# Patient Record
Sex: Male | Born: 1985
Health system: Southern US, Community
[De-identification: ages and names within clinical notes are randomized; demographics above are authoritative.]

## PROBLEM LIST (undated history)

## (undated) DIAGNOSIS — J42 Unspecified chronic bronchitis: Secondary | ICD-10-CM

## (undated) DIAGNOSIS — Z8619 Personal history of other infectious and parasitic diseases: Secondary | ICD-10-CM

## (undated) DIAGNOSIS — K219 Gastro-esophageal reflux disease without esophagitis: Secondary | ICD-10-CM

## (undated) DIAGNOSIS — Z114 Encounter for screening for human immunodeficiency virus [HIV]: Secondary | ICD-10-CM

## (undated) HISTORY — DX: Gastro-esophageal reflux disease without esophagitis: K21.9

## (undated) HISTORY — DX: Unspecified chronic bronchitis: J42

## (undated) HISTORY — DX: Encounter for screening for human immunodeficiency virus (HIV): Z11.4

## (undated) HISTORY — DX: Personal history of other infectious and parasitic diseases: Z86.19

---

## 2015-01-03 ENCOUNTER — Encounter (HOSPITAL_COMMUNITY): Payer: Self-pay | Admitting: Emergency Medicine

## 2015-01-03 ENCOUNTER — Emergency Department (HOSPITAL_COMMUNITY)
Admission: EM | Admit: 2015-01-03 | Discharge: 2015-01-03 | Disposition: A | Discharge status: Home / Self Care | Payer: Self-pay | Attending: Emergency Medicine | Admitting: Emergency Medicine

## 2015-01-03 DIAGNOSIS — Z720 Tobacco use: Secondary | ICD-10-CM | POA: Insufficient documentation

## 2015-01-03 DIAGNOSIS — R300 Dysuria: Secondary | ICD-10-CM | POA: Insufficient documentation

## 2015-01-03 DIAGNOSIS — Z79899 Other long term (current) drug therapy: Secondary | ICD-10-CM | POA: Insufficient documentation

## 2015-01-03 LAB — URINALYSIS, ROUTINE W REFLEX MICROSCOPIC
Bilirubin Urine: NEGATIVE
Glucose, UA: NEGATIVE mg/dL
Hgb urine dipstick: NEGATIVE
Ketones, ur: NEGATIVE mg/dL
Leukocytes, UA: NEGATIVE
NITRITE: NEGATIVE
PH: 6.5 (ref 5.0–8.0)
Protein, ur: NEGATIVE mg/dL
SPECIFIC GRAVITY, URINE: 1.029 (ref 1.005–1.030)
Urobilinogen, UA: 1 mg/dL (ref 0.0–1.0)

## 2015-01-03 MED ORDER — CEFTRIAXONE SODIUM 250 MG IJ SOLR
250.0000 mg | Freq: Once | INTRAMUSCULAR | Status: AC
  Administered 2015-01-03: 250 mg via INTRAMUSCULAR
  Filled 2015-01-03: qty 250

## 2015-01-03 MED ORDER — LIDOCAINE HCL 1 % IJ SOLN
INTRAMUSCULAR | Status: AC
Start: 2015-01-03 — End: 2015-01-03
  Administered 2015-01-03: 20 mL
  Filled 2015-01-03: qty 20

## 2015-01-03 MED ORDER — AZITHROMYCIN 250 MG PO TABS
1000.0000 mg | ORAL_TABLET | Freq: Once | ORAL | Status: AC
  Administered 2015-01-03: 1000 mg via ORAL
  Filled 2015-01-03: qty 4

## 2015-01-03 NOTE — Discharge Instructions (Signed)
Please call your doctor for a followup appointment within 24-48 hours. When you talk to your doctor please let them know that you were seen in the emergency department and have them acquire all of your records so that they can discuss the findings with you and formulate a treatment plan to fully care for your new and ongoing problems. Please follow-up with the health department and health and wellness Center Please avoid any sexual activity until all lab results have returned and properly treated if any come back positive Please participate in safe sex habits, such as using condoms Please have partner(s) in past 6 months be tested  Please rest and stay hydrated Please continue to monitor symptoms closely and if symptoms are to worsen or change (fever greater than 101, chills, sweating, nausea, vomiting, chest pain, shortness of breathe, difficulty breathing, weakness, numbness, tingling, worsening or changes to pain pattern, swelling to the penis, testicular pain, rashes, pus drainage, red streaks running down the leg) please report back to the Emergency Department immediately.    Dysuria Dysuria is the medical term for pain with urination. There are many causes for dysuria, but urinary tract infection is the most common. If a urinalysis was performed it can show that there is a urinary tract infection. A urine culture confirms that you or your child is sick. You will need to follow up with a healthcare provider because:  If a urine culture was done you will need to know the culture results and treatment recommendations.  If the urine culture was positive, you or your child will need to be put on antibiotics or know if the antibiotics prescribed are the right antibiotics for your urinary tract infection.  If the urine culture is negative (no urinary tract infection), then other causes may need to be explored or antibiotics need to be stopped. Today laboratory work may have been done and there does  not seem to be an infection. If cultures were done they will take at least 24 to 48 hours to be completed. Today x-rays may have been taken and they read as normal. No cause can be found for the problems. The x-rays may be re-read by a radiologist and you will be contacted if additional findings are made. You or your child may have been put on medications to help with this problem until you can see your primary caregiver. If the problems get better, see your primary caregiver if the problems return. If you were given antibiotics (medications which kill germs), take all of the mediations as directed for the full course of treatment.  If laboratory work was done, you need to find the results. Leave a telephone number where you can be reached. If this is not possible, make sure you find out how you are to get test results. HOME CARE INSTRUCTIONS   Drink lots of fluids. For adults, drink eight, 8 ounce glasses of clear juice or water a day. For children, replace fluids as suggested by your caregiver.  Empty the bladder often. Avoid holding urine for long periods of time.  After a bowel movement, women should cleanse front to back, using each tissue only once.  Empty your bladder before and after sexual intercourse.  Take all the medicine given to you until it is gone. You may feel better in a few days, but TAKE ALL MEDICINE.  Avoid caffeine, tea, alcohol and carbonated beverages, because they tend to irritate the bladder.  In men, alcohol may irritate the prostate.  Only  take over-the-counter or prescription medicines for pain, discomfort, or fever as directed by your caregiver.  If your caregiver has given you a follow-up appointment, it is very important to keep that appointment. Not keeping the appointment could result in a chronic or permanent injury, pain, and disability. If there is any problem keeping the appointment, you must call back to this facility for assistance. SEEK IMMEDIATE  MEDICAL CARE IF:   Back pain develops.  A fever develops.  There is nausea (feeling sick to your stomach) or vomiting (throwing up).  Problems are no better with medications or are getting worse. MAKE SURE YOU:   Understand these instructions.  Will watch your condition.  Will get help right away if you are not doing well or get worse. Document Released: 07/01/2004 Document Revised: 12/26/2011 Document Reviewed: 05/08/2008 [facility] Patient Information 2015 [place], [place]. This information is not intended to replace advice given to you by your health care provider. Make sure you discuss any questions you have with your health care provider.   Emergency Department Resource Guide 1) Find a Doctor and Pay Out of Pocket Although you won't have to find out who is covered by your insurance plan, it is a good idea to ask around and get recommendations. You will then need to call the office and see if the doctor you have chosen will accept you as a new patient and what types of options they offer for patients who are self-pay. Some doctors offer discounts or will set up payment plans for their patients who do not have insurance, but you will need to ask so you aren't surprised when you get to your appointment.  2) Contact Your Local Health Department Not all health departments have doctors that can see patients for sick visits, but many do, so it is worth a call to see if yours does. If you don't know where your local health department is, you can check in your phone book. The CDC also has a tool to help you locate your state's health department, and many state websites also have listings of all of their local health departments.  3) Find a Walk-in Clinic If your illness is not likely to be very severe or complicated, you may want to try a walk in clinic. These are popping up all over the country in pharmacies, drugstores, and shopping centers. They're usually staffed by nurse practitioners or  physician assistants that have been trained to treat common illnesses and complaints. They're usually fairly quick and inexpensive. However, if you have serious medical issues or chronic medical problems, these are probably not your best option.  No Primary Care Doctor: - Call Health Connect at  [phone] - they can help you locate a primary care doctor that  accepts your insurance, provides certain services, etc. - Physician Referral Service- [phone]  Chronic Pain Problems: Organization         Address  Phone   Notes  [facility]  [phone] Patients need to be referred by their primary care doctor.   Medication Assistance: Organization         Address  Phone   Notes  [facility] Medication [facility] [address] [place] [place], [place] [address] [phone] --Must be a resident of [facility] -- Must have NO insurance coverage whatsoever (no Medicaid/ Medicare, etc.) -- The pt. MUST have a primary care doctor that directs their care regularly and follows them in the community   MedAssist  (  [phone]   [employer]  [phone]    Agencies that provide inexpensive medical care: Organization         Address  Phone   Notes  [facility]  [phone]   [facility]    [phone]   [facility] [address] [place], [place] [address] [phone]   [facility] [address] [place], [place] [phone]   Planned Parenthood    [phone]   [facility]    [phone]   [facility]  [address], [place] Phone:  [phone], Fax:  [phone] Hours of Operation:  9 am - 6 pm, M-F.  Also accepts Medicaid/Medicare and self-pay.  [facility]  [address], [place] Phone: [phone], Fax: [phone]. Hours of Operation:  8:30 am - 5:30 pm, M-F.   Also accepts Medicaid and self-pay.  [facility] [place] [address], [place] Phone: [phone]   [facility] [address] Natasha Bence [place], [place] [phone], Ext. [phone] Mondays & Thursdays: 7-9 AM.  First 15 patients are seen on a first come, first serve basis.    Medicaid-accepting [facility] Providers:  Organization         Address  Phone   Notes  [facility] [address], [place] [phone] Also accepts self-pay patients.  [facility] [address] Laurell Josephs [place], [place]  [phone]   [facility] [address], [place] [phone]   [facility] [address] [place] [phone]   Renaye Rakers [address], [place]   [phone] Only accepts [place] Access [place] patients after they have their name applied to their card.   Self-Pay (no insurance) in [facility]:  Organization         Address  Phone   Notes  Sickle Cell Patients, [facility] [address] [place], [place] [phone]   [facility] [address] [place], [place] [phone]   [facility] [place]  [address], [place] [phone]   [facility]/Dr. Osei-Bonsu  [address] [place] or [address], [place] [phone] Phone number for both [place] and [place] locations is the same.  [facility]gent Medical and [facility] [address], [place] [phone]   [facility] [address] [place] or [address] [phone]   [facility] [address], [place] [phone], phone; [phone], fax Sees patients 1st and 3rd Saturday of every month.  Must not qualify for public or private insurance (i.e. Medicaid, Medicare, [place] Health Choice, Veterans'  Benefits)  Household income should be no more than 200% of the poverty level The clinic cannot treat you if you are pregnant or think you are pregnant  Sexually transmitted diseases are not treated at the clinic.    Dental Care: Organization         Address  Phone  Notes  [facility] Department of [facility] [address] [place], [place] [phone] Accepts children up to age 28 who are enrolled in [place] or [place] Health Choice; pregnant women  with a Medicaid card; and children who have applied for Medicaid or [place] Health Choice, but were declined, whose parents can pay a reduced fee at time of service.  [facility] Department of [facility]  [address], [place] [phone] Accepts children up to age 107 who are enrolled in [place] or [place] Health Choice; pregnant women with a Medicaid card; and children who have applied for Medicaid or [place] Health Choice, but were declined, whose parents can pay a reduced fee at time of service.  [facility]  [address] [place], [place] [phone] Patients are seen by appointment only. Walk-ins are not accepted. [facility] will see patients 7 years of age and older. Monday - Tuesday (8am-5pm) Most Wednesdays (8:30-5pm) $30 per visit, cash only  [facility] Adult Dental Access PROGRAM  [address], [facility] [phone] Patients are seen by appointment only. Walk-ins are not accepted. [facility] will see patients 14 years of age and older. One Wednesday Evening (Monthly: Volunteer Based).  $30 per visit, cash only  [employer]  [phone] for adults; Children under age 43, call Graduate Pediatric Dentistry at [phone]. Children aged 74-14, please call [phone] to request a pediatric application.  Dental services are provided in all areas of dental care including fillings, crowns and bridges, complete and partial  dentures, implants, gum treatment, root canals, and extractions. Preventive care is also provided. Treatment is provided to both adults and children. Patients are selected via a lottery and there is often a waiting list.   [facility] [address] [place]  [phone] [email or web address]   [facility] [address] [place], [place] [phone], Ext. [phone] Second and Fourth Thursday of each month, opens at 6:30 AM; Clinic ends at 9 AM.  Patients are seen on a first-come first-served basis, and a limited number are seen during each clinic.   [facility]  [address] Ether Griffins [place], [place] [phone]   Eligibility Requirements You must have lived in [place], [place], or [place] counties for at least the last three months.   You cannot be eligible for state or federal sponsored [place], including [employer], [place], or [employer].   You generally cannot be eligible for healthcare insurance through your employer.    How to apply: Eligibility screenings are held every Tuesday and Wednesday afternoon from 1:00 pm until 4:00 pm. You do not need an appointment for the interview!  [facility] [address] [place], [place] [phone]   [facility] Health Department  [phone]   [facility] Health Department  [phone]   [facility] Health Department  [phone]    Behavioral Health Resources in the Community: Intensive Outpatient Programs Organization         Address  Phone  Notes  [facility] [address], [place], [place] [phone]   [facility] Outpatient [address], [place], [place] [phone]   [facility] [address] [place], [place]  [phone]   [facility] Mental Health [address]  [place], [place] [phone] or [phone]   Substance Abuse Resources Organization          Address  Phone  Notes  [facility]  [phone]   [facility]  [phone]   [facility]  [phone]   [facility]  [phone]   Residential & Outpatient Substance Abuse Program  [phone]   Psychological Services Organization         Address  Phone  Notes  [facility] Behavioral Health  [phone]   [facility] Services  [phone]   [facility] Mental Health [address] [place] [address] [place] [phone] or [phone]    Mobile Crisis Teams Organization         Address  Phone  Notes  [facility], Mobile Crisis Care Unit  [phone]   [facility]  [address] [place], [place] [phone]   Doristine Locks [address] [place] [phone]    Self-Help/Support Groups Organization         Address  Phone             Notes  [facility] [place] - variety of support groups  [phone]- [phone] Call for more information  Narcotics Anonymous (NA), [facility] [address], [employer] [place]  2 meetings at this location   Statistician         Address  Phone  Notes  [facility] [address],    [place]  [phone]   [facility]  [address] [place] [address], [place], [place] [phone]   [facility] [address] [place], [place] [phone] Admissions: 8am-3pm M-F  [facility] [address],    [place], [place] [phone]   [facility] [address] [place], [place], [place] [phone]   [facility] [address]  [place], [place] [phone]   [facility] - Intensive Outpatient [address] Laurell Joseph[address], [place], [place] [phone]   [facility] ([facility] [address] [place].,  [place], [place] [phone] or [phone]   [facility] ([facility]) [address] [place], [place]  [phone] Accepts Medicaid  [facility] [address]  [place] [phone] Substance Abuse/Addiction Treatment   [facility] Organization         Address  Phone  Notes  [facility]  [phone]   Angie Fava, PhD [address] Ervin Knack [place], [place]   [phone] or [phone]   [facility] Behavioral   [address] [place], [place] [phone]   [facility] [address] [place], [place] [phone] Insurance/Medicaid/sponsorship through [facility] and Families [address]                                    [place], [place] [phone] Therapy/tele-psych/case  [facility] [address] [place] [address], [place] [phone]    Dr. Lolly Mustache  [phone]   [facility]  [employer] [facility] Dept. 1) [address], [place] 2) [address] [place] 3)  [address], [place] [phone]  [phone]   [facility] Child Abuse Hotline [phone] or [phone] (After Hours)

## 2015-01-03 NOTE — ED Provider Notes (Signed)
CSN: [id number]     Arrival date & time 01/03/15  1047 History   First MD Initiated Contact with Patient 01/03/15 1054     Chief Complaint  Patient presents with  . Dysuria    5 day hx of painful urination     (Consider location/radiation/quality/duration/timing/severity/associated sxs/prior Treatment) The history is provided by the patient. No language interpreter was used.  William Cowan is a 29 year old male with no known significant past medical history presenting to emergency department with dysuria that has been ongoing since Monday. Patient reported that he is been experiencing burning with each urination. Patient reported that he noticed a dry patch on his penis, near the glans penis denied discharge or bleeding. Reported that he is sexually active, has had one partner within the past 3 months and is not using protection. Denied fever, chills, abdominal pain, back pain, neck pain, hematuria, penile drainage, rash, nausea, vomiting, sores, lesions, hematuria, chest pain, shortness of breath, difficulty breathing. PCP none  History reviewed. No pertinent past medical history. History reviewed. No pertinent past surgical history. Family History  Problem Relation Age of Onset  . Heart failure Father   . Hypertension Father    History  Substance Use Topics  . Smoking status: Current Every Day Smoker    Types: Cigarettes  . Smokeless tobacco: Not on file  . Alcohol Use: Yes    Review of Systems  Constitutional: Negative for fever and chills.  Respiratory: Negative for chest tightness and shortness of breath.   Cardiovascular: Negative for chest pain.  Gastrointestinal: Negative for nausea, vomiting and abdominal pain.  Genitourinary: Positive for dysuria. Negative for hematuria, decreased urine volume, discharge, penile swelling, scrotal swelling, penile pain and testicular pain.  Musculoskeletal: Negative for back pain, neck pain and neck stiffness.      Allergies   Review of patient's allergies indicates no known allergies.  Home Medications   Prior to Admission medications   Medication Sig Start Date End Date Taking? Authorizing Provider  Multiple Vitamin (MULTIVITAMIN WITH MINERALS) TABS tablet Take 1 tablet by mouth daily.   Yes Historical Provider, MD   BP 113/65 mmHg  Pulse 61  Temp(Src) 97.6 F (36.4 C) (Oral)  Resp 18  Wt 190 lb (86.183 kg)  SpO2 100% Physical Exam  Constitutional: He is oriented to person, place, and time. He appears well-developed and well-nourished. No distress.  HENT:  Head: Normocephalic and atraumatic.  Mouth/Throat: Oropharynx is clear and moist. No oropharyngeal exudate.  Eyes: Conjunctivae and EOM are normal. Pupils are equal, round, and reactive to light. Right eye exhibits no discharge. Left eye exhibits no discharge.  Neck: Normal range of motion. Neck supple.  Cardiovascular: Normal rate, regular rhythm and normal heart sounds.   Pulses:      Radial pulses are 2+ on the right side, and 2+ on the left side.  Pulmonary/Chest: Effort normal and breath sounds normal. No respiratory distress. He has no wheezes. He has no rales.  Abdominal: Soft. Bowel sounds are normal. He exhibits no distension. There is no tenderness. There is no rebound and no guarding.  Genitourinary:  Penile exam: Negative swelling, erythema, inflammation, lesions, sores, deformities identified to the penis or testicles. Negative chancres noted. Negative active drainage or bleeding identified from the penis. Negative testicular swelling or pain. Negative inguinal adenopathy bilaterally. Exam chaperoned with nurse, Gar Gibbon.  Musculoskeletal: Normal range of motion.  Neurological: He is alert and oriented to person, place, and time. No cranial nerve deficit. He exhibits  normal muscle tone. Coordination normal.  Skin: Skin is warm and dry. No rash noted. He is not diaphoretic. No erythema.  Negative rashes identified. Negative rashes to  the palms of the hands bilaterally.  Psychiatric: He has a normal mood and affect. His behavior is normal. Thought content normal.  Nursing note and vitals reviewed.   ED Course  Procedures (including critical care time)  Results for orders placed or performed during the hospital encounter of 01/03/15  Urinalysis, Routine w reflex microscopic  Result Value Ref Range   Color, Urine YELLOW YELLOW   APPearance CLEAR CLEAR   Specific Gravity, Urine 1.029 1.005 - 1.030   pH 6.5 5.0 - 8.0   Glucose, UA NEGATIVE NEGATIVE mg/dL   Hgb urine dipstick NEGATIVE NEGATIVE   Bilirubin Urine NEGATIVE NEGATIVE   Ketones, ur NEGATIVE NEGATIVE mg/dL   Protein, ur NEGATIVE NEGATIVE mg/dL   Urobilinogen, UA 1.0 0.0 - 1.0 mg/dL   Nitrite NEGATIVE NEGATIVE   Leukocytes, UA NEGATIVE NEGATIVE    Labs Review Labs Reviewed  URINALYSIS, ROUTINE W REFLEX MICROSCOPIC  HIV ANTIBODY (ROUTINE TESTING)  RPR  GC/CHLAMYDIA PROBE AMP ([place])    Imaging Review No results found.   EKG Interpretation None      MDM   Final diagnoses:  Dysuria    Medications  cefTRIAXone (ROCEPHIN) injection 250 mg (250 mg Intramuscular Given 01/03/15 1228)  azithromycin (ZITHROMAX) tablet 1,000 mg (1,000 mg Oral Given 01/03/15 1226)  lidocaine (XYLOCAINE) 1 % (with pres) injection (20 mLs  Given 01/03/15 1230)    Filed Vitals:   01/03/15 1056  BP: 113/65  Pulse: 61  Temp: 97.6 F (36.4 C)  TempSrc: Oral  Resp: 18  Weight: 190 lb (86.183 kg)  SpO2: 100%   Patient presenting to the ED with dysuria that has been ongoing since Monday. Urinalysis negative for acute infection-negative findings of hemoglobin, nitrites, leukocytes. Unremarkable pelvic exam. HIV, RPR, GC Chlamydia probe collected and pending. Patient treated prophylactically with STD medications. Patient stable, afebrile. Patient not septic appearing. Negative signs of respiratory distress. Discharge patient. Referred patient to health and wellness  Center and health department. Discussed with patient recommendations for partner to be tested as well. Discussed with patient to avoid any sexual activity until all lab results back and patient properly treated. Discussed with patient safe sex habits. Discussed with patient to closely monitor symptoms and if symptoms are to worsen or change to report back to the ED - strict return instructions given.  Patient agreed to plan of care, understood, all questions answered.    Raymon MuttonMarissa Lenoria Narine, PA-C 01/03/15 1241  Rolan BuccoMelanie Belfi, MD 01/03/15 [phone]

## 2015-01-03 NOTE — ED Notes (Addendum)
Pt reports pain and burning on urination x 5 days. Denies blood in urine. C/o pain at tip of penis

## 2015-01-04 LAB — RPR: RPR: NONREACTIVE

## 2015-01-04 LAB — HIV ANTIBODY (ROUTINE TESTING W REFLEX): HIV Screen 4th Generation wRfx: NONREACTIVE

## 2015-01-05 LAB — GC/CHLAMYDIA PROBE AMP (~~LOC~~) NOT AT ARMC
Chlamydia: NEGATIVE
NEISSERIA GONORRHEA: NEGATIVE

## 2018-10-11 ENCOUNTER — Other Ambulatory Visit: Payer: Self-pay | Admitting: Family Medicine

## 2018-10-11 ENCOUNTER — Ambulatory Visit (INDEPENDENT_AMBULATORY_CARE_PROVIDER_SITE_OTHER): Payer: BLUE CROSS/BLUE SHIELD | Admitting: Family Medicine

## 2018-10-11 ENCOUNTER — Encounter: Payer: Self-pay | Admitting: Family Medicine

## 2018-10-11 VITALS — BP 114/60 | HR 64 | Temp 98.3°F | Ht 72.0 in | Wt 183.4 lb

## 2018-10-11 DIAGNOSIS — D708 Other neutropenia: Secondary | ICD-10-CM | POA: Diagnosis not present

## 2018-10-11 DIAGNOSIS — Z23 Encounter for immunization: Secondary | ICD-10-CM

## 2018-10-11 DIAGNOSIS — R21 Rash and other nonspecific skin eruption: Secondary | ICD-10-CM | POA: Diagnosis not present

## 2018-10-11 DIAGNOSIS — Z0000 Encounter for general adult medical examination without abnormal findings: Secondary | ICD-10-CM

## 2018-10-11 LAB — COMPREHENSIVE METABOLIC PANEL
ALT: 23 U/L (ref 0–53)
AST: 24 U/L (ref 0–37)
Albumin: 4.4 g/dL (ref 3.5–5.2)
Alkaline Phosphatase: 41 U/L (ref 39–117)
BUN: 14 mg/dL (ref 6–23)
CO2: 27 mEq/L (ref 19–32)
Calcium: 9.6 mg/dL (ref 8.4–10.5)
Chloride: 107 mEq/L (ref 96–112)
Creatinine, Ser: 1.14 mg/dL (ref 0.40–1.50)
GFR: 96.88 mL/min (ref >60.00)
GLUCOSE: 102 mg/dL — AB (ref 70–99)
POTASSIUM: 4.3 meq/L (ref 3.5–5.1)
Sodium: 141 mEq/L (ref 135–145)
Total Bilirubin: 0.8 mg/dL (ref 0.2–1.2)
Total Protein: 7.3 g/dL (ref 6.0–8.3)

## 2018-10-11 LAB — CBC WITH DIFFERENTIAL/PLATELET
Basophils Absolute: 0.1 10*3/uL (ref 0.0–0.1)
Basophils Relative: 1.4 % (ref 0.0–3.0)
Eosinophils Absolute: 1.5 10*3/uL — ABNORMAL HIGH (ref 0.0–0.7)
Eosinophils Relative: 25.7 % — ABNORMAL HIGH (ref 0.0–5.0)
HCT: 45.9 % (ref 39.0–52.0)
HEMOGLOBIN: 15.5 g/dL (ref 13.0–17.0)
LYMPHS ABS: 2.2 10*3/uL (ref 0.7–4.0)
Lymphocytes Relative: 37.1 % (ref 12.0–46.0)
MCHC: 33.8 g/dL (ref 30.0–36.0)
MCV: 93 fl (ref 78.0–100.0)
MONOS PCT: 9.9 % (ref 3.0–12.0)
Monocytes Absolute: 0.6 10*3/uL (ref 0.1–1.0)
Neutro Abs: 1.5 10*3/uL (ref 1.4–7.7)
Neutrophils Relative %: 25.9 % — ABNORMAL LOW (ref 43.0–77.0)
Platelets: 263 10*3/uL (ref 150.0–400.0)
RBC: 4.94 Mil/uL (ref 4.22–5.81)
RDW: 13.9 % (ref 11.5–15.5)
WBC: 6 10*3/uL (ref 4.0–10.5)

## 2018-10-11 LAB — LIPID PANEL
CHOL/HDL RATIO: 3
Cholesterol: 150 mg/dL (ref 0–200)
HDL: 56.7 mg/dL (ref >39.00)
LDL Cholesterol: 78 mg/dL (ref 0–99)
NONHDL: 93.17
Triglycerides: 75 mg/dL (ref 0.0–149.0)
VLDL: 15 mg/dL (ref 0.0–40.0)

## 2018-10-11 LAB — TSH: TSH: 1.08 u[IU]/mL (ref 0.35–4.50)

## 2018-10-11 MED ORDER — CLOTRIMAZOLE 1 % EX CREA: 1.0000 "application " | TOPICAL_CREAM | Freq: Two times a day (BID) | CUTANEOUS | 0 refills | Status: DC

## 2018-10-11 NOTE — Progress Notes (Signed)
Patient: William Cowan MRN: [id number] DOB: 1986/07/18 PCP: Orland MustardWolfe, Shatasha Lambing, MD     Subjective:  Chief Complaint  Patient presents with  . Establish Care  . Annual Exam    HPI: The patient is a 32 y.o. male who presents today for annual exam. He denies any changes to past medical history. There have been no recent hospitalizations. They are following a well balanced diet and exercise plan. Weight has been stable. No complaints today.   Rash on his right calf that is circular and itchy and scaly. He states it comes and goes. Sometimes has a red border.   CAD in his father with multiple Mis. Stents placed. NO bypass. No colon cancer or prostate in first degree relatives.  Immunization History  Administered Date(s) Administered  . Tdap 10/11/2018    Hiv: done Flu: done Tdap: today   Review of Systems  Constitutional: Negative for chills, fatigue and fever.  HENT: Negative for dental problem, ear pain, hearing loss and trouble swallowing.   Eyes: Negative for visual disturbance.  Respiratory: Negative for cough, chest tightness and shortness of breath.   Cardiovascular: Negative for chest pain, palpitations and leg swelling.  Gastrointestinal: Negative for abdominal pain, blood in stool, diarrhea and nausea.  Endocrine: Negative for cold intolerance, polydipsia, polyphagia and polyuria.  Genitourinary: Negative for dysuria and hematuria.  Musculoskeletal: Negative for arthralgias, back pain and neck pain.  Skin: Positive for rash.       Pt c/o intermittent itchy rash on both calves  Neurological: Negative for dizziness and headaches.  Psychiatric/Behavioral: Positive for sleep disturbance. Negative for dysphoric mood. The patient is not nervous/anxious.     Allergies Patient has No Known Allergies.  Past Medical History Patient  has a past medical history of Chronic bronchitis (HCC), History of chicken pox, and Screening for HIV (human immunodeficiency virus).  Surgical  History Patient  has no past surgical history on file.  Family History Pateint's family history includes Cancer in his maternal grandfather; Heart attack in his father; Heart failure in his father; Hyperlipidemia in his father; Hypertension in his father; Miscarriages / [place] in his mother.  Social History Patient  reports that he has been smoking cigarettes. He has never used smokeless tobacco. He reports current alcohol use. He reports current drug use. Drug: Marijuana.    Objective: Vitals:   10/11/18 1004  BP: 114/60  Pulse: 64  Temp: 98.3 F (36.8 C)  TempSrc: Oral  SpO2: 97%  Weight: 183 lb 6.4 oz (83.2 kg)  Height: 6' (1.829 m)    Body mass index is 24.87 kg/m.  Physical Exam Vitals signs reviewed.  Constitutional:      Appearance: He is well-developed.  HENT:     Right Ear: External ear normal.     Left Ear: External ear normal.  Eyes:     Conjunctiva/sclera: Conjunctivae normal.     Pupils: Pupils are equal, round, and reactive to light.  Neck:     Musculoskeletal: Normal range of motion and neck supple.     Thyroid: No thyromegaly.  Cardiovascular:     Rate and Rhythm: Normal rate and regular rhythm.     Heart sounds: Normal heart sounds. No murmur.  Pulmonary:     Effort: Pulmonary effort is normal.     Breath sounds: Normal breath sounds.  Abdominal:     General: Bowel sounds are normal. There is no distension.     Palpations: Abdomen is soft.     Tenderness: There is  no abdominal tenderness.  Lymphadenopathy:     Cervical: No cervical adenopathy.  Skin:    General: Skin is warm and dry.     Comments: Back of right calf: macular lesion that is scaly in nature. About 2cm in diameter. No erythematous border   Neurological:     Mental Status: He is alert and oriented to person, place, and time.     Cranial Nerves: No cranial nerve deficit.     Coordination: Coordination normal.     Deep Tendon Reflexes: Reflexes normal.  Psychiatric:         Behavior: Behavior normal.    Depression screen PHQ 2/9 10/11/2018  Decreased Interest 0  Down, Depressed, Hopeless 0  PHQ - 2 Score 0       Assessment/plan: 1. Annual physical exam Overall healthy and utd on health maintenance. Routine lab work today. F/u in one year or as needed.  Patient counseling [x]    Nutrition: Stressed importance of moderation in sodium/caffeine intake, saturated fat and cholesterol, caloric balance, sufficient intake of fresh fruits, vegetables, fiber, calcium, iron, and 1 mg of folate supplement per day (for females capable of pregnancy).  [x]    Stressed the importance of regular exercise.   [x]    Substance Abuse: Discussed cessation/primary prevention of tobacco, alcohol, or other drug use; driving or other dangerous activities under the influence; availability of treatment for abuse.   [x]    Injury prevention: Discussed safety belts, safety helmets, smoke detector, smoking near bedding or upholstery.   [x]    Sexuality: Discussed sexually transmitted diseases, partner selection, use of condoms, avoidance of unintended pregnancy  and contraceptive alternatives.  [x]    Dental health: Discussed importance of regular tooth brushing, flossing, and dental visits.  [x]    Health maintenance and immunizations reviewed. Please refer to Health maintenance section.   - Comprehensive metabolic panel - CBC with Differential/Platelet - TSH - Lipid panel  2. Need for prophylactic vaccination with combined diphtheria-tetanus-pertussis (DTP) vaccine  - Tdap vaccine greater than or equal to 7yo IM  3. Rash Clotrimazole BID x 1 week after cleared. Let me know if not better. May need to try steroid cream, but looks like ringworm.     Return in about 1 year (around 10/12/2019).     Orland MustardAllison Destani Wamser, MD [facility]  10/11/2018

## 2018-10-12 NOTE — Addendum Note (Signed)
Addended by: London SheerFRIZZELL, Latresa Gasser T on: 10/12/2018 11:45 AM   Modules accepted: Orders

## 2018-10-15 LAB — PATHOLOGIST SMEAR REVIEW

## 2018-10-24 ENCOUNTER — Other Ambulatory Visit: Payer: Self-pay | Admitting: Family Medicine

## 2018-10-24 DIAGNOSIS — D721 Eosinophilia, unspecified: Secondary | ICD-10-CM

## 2018-10-26 ENCOUNTER — Other Ambulatory Visit: Payer: Self-pay

## 2018-10-26 DIAGNOSIS — D721 Eosinophilia, unspecified: Secondary | ICD-10-CM

## 2018-12-28 ENCOUNTER — Other Ambulatory Visit: Payer: BLUE CROSS/BLUE SHIELD

## 2018-12-28 ENCOUNTER — Other Ambulatory Visit: Payer: Self-pay

## 2018-12-28 DIAGNOSIS — D721 Eosinophilia, unspecified: Secondary | ICD-10-CM

## 2019-09-14 DIAGNOSIS — J069 Acute upper respiratory infection, unspecified: Secondary | ICD-10-CM | POA: Diagnosis not present

## 2019-09-14 DIAGNOSIS — Z20828 Contact with and (suspected) exposure to other viral communicable diseases: Secondary | ICD-10-CM | POA: Diagnosis not present

## 2019-09-27 DIAGNOSIS — Z20828 Contact with and (suspected) exposure to other viral communicable diseases: Secondary | ICD-10-CM | POA: Diagnosis not present

## 2019-10-14 ENCOUNTER — Encounter: Payer: Self-pay | Admitting: Family Medicine

## 2019-10-14 ENCOUNTER — Ambulatory Visit (INDEPENDENT_AMBULATORY_CARE_PROVIDER_SITE_OTHER): Payer: BC Managed Care – PPO | Admitting: Family Medicine

## 2019-10-14 VITALS — BP 110/70 | HR 68 | Temp 97.9°F | Ht 72.0 in | Wt 183.2 lb

## 2019-10-14 DIAGNOSIS — Z0000 Encounter for general adult medical examination without abnormal findings: Secondary | ICD-10-CM

## 2019-10-14 DIAGNOSIS — L720 Epidermal cyst: Secondary | ICD-10-CM | POA: Diagnosis not present

## 2019-10-14 LAB — COMPREHENSIVE METABOLIC PANEL
ALT: 14 U/L (ref 0–53)
AST: 21 U/L (ref 0–37)
Albumin: 4.8 g/dL (ref 3.5–5.2)
Alkaline Phosphatase: 43 U/L (ref 39–117)
BUN: 11 mg/dL (ref 6–23)
CO2: 29 mEq/L (ref 19–32)
Calcium: 9.7 mg/dL (ref 8.4–10.5)
Chloride: 103 mEq/L (ref 96–112)
Creatinine, Ser: 1.17 mg/dL (ref 0.40–1.50)
GFR: 87.87 mL/min (ref >60.00)
Glucose, Bld: 93 mg/dL (ref 70–99)
Potassium: 3.9 mEq/L (ref 3.5–5.1)
Sodium: 140 mEq/L (ref 135–145)
Total Bilirubin: 0.5 mg/dL (ref 0.2–1.2)
Total Protein: 7.7 g/dL (ref 6.0–8.3)

## 2019-10-14 LAB — LIPID PANEL
Cholesterol: 143 mg/dL (ref 0–200)
HDL: 43.6 mg/dL (ref >39.00)
LDL Cholesterol: 67 mg/dL (ref 0–99)
NonHDL: 99.8
Total CHOL/HDL Ratio: 3
Triglycerides: 165 mg/dL — ABNORMAL HIGH (ref 0.0–149.0)
VLDL: 33 mg/dL (ref 0.0–40.0)

## 2019-10-14 LAB — CBC WITH DIFFERENTIAL/PLATELET
Basophils Absolute: 0.1 10*3/uL (ref 0.0–0.1)
Basophils Relative: 1.5 % (ref 0.0–3.0)
Eosinophils Absolute: 0.2 10*3/uL (ref 0.0–0.7)
Eosinophils Relative: 3.5 % (ref 0.0–5.0)
HCT: 44.4 % (ref 39.0–52.0)
Hemoglobin: 15 g/dL (ref 13.0–17.0)
Lymphocytes Relative: 43.9 % (ref 12.0–46.0)
Lymphs Abs: 2.4 10*3/uL (ref 0.7–4.0)
MCHC: 33.9 g/dL (ref 30.0–36.0)
MCV: 93.1 fl (ref 78.0–100.0)
Monocytes Absolute: 0.7 10*3/uL (ref 0.1–1.0)
Monocytes Relative: 13.1 % — ABNORMAL HIGH (ref 3.0–12.0)
Neutro Abs: 2.1 10*3/uL (ref 1.4–7.7)
Neutrophils Relative %: 38 % — ABNORMAL LOW (ref 43.0–77.0)
Platelets: 268 10*3/uL (ref 150.0–400.0)
RBC: 4.77 Mil/uL (ref 4.22–5.81)
RDW: 13.2 % (ref 11.5–15.5)
WBC: 5.4 10*3/uL (ref 4.0–10.5)

## 2019-10-14 LAB — VITAMIN D 25 HYDROXY (VIT D DEFICIENCY, FRACTURES): VITD: 15.68 ng/mL — ABNORMAL LOW (ref 30.00–100.00)

## 2019-10-14 LAB — TSH: TSH: 1.04 u[IU]/mL (ref 0.35–4.50)

## 2019-10-14 NOTE — Patient Instructions (Signed)
Surgery referral for cyst in your groin. Let me know if you don't hear anything.   So nice to see ya'll! Stay safe and healthy and happy new year! Dr. Rogers Blocker   Preventive Care 83-31 Years Old, Male Preventive care refers to lifestyle choices and visits with your health care provider that can promote health and wellness. This includes:  A yearly physical exam. This is also called an annual well check.  Regular dental and eye exams.  Immunizations.  Screening for certain conditions.  Healthy lifestyle choices, such as eating a healthy diet, getting regular exercise, not using drugs or products that contain nicotine and tobacco, and limiting alcohol use. What can I expect for my preventive care visit? Physical exam Your health care provider will check:  Height and weight. These may be used to calculate body mass index (BMI), which is a measurement that tells if you are at a healthy weight.  Heart rate and blood pressure.  Your skin for abnormal spots. Counseling Your health care provider may ask you questions about:  Alcohol, tobacco, and drug use.  Emotional well-being.  Home and relationship well-being.  Sexual activity.  Eating habits.  Work and work Statistician. What immunizations do I need?  Influenza (flu) vaccine  This is recommended every year. Tetanus, diphtheria, and pertussis (Tdap) vaccine  You may need a Td booster every 31 years. Varicella (chickenpox) vaccine  You may need this vaccine if you have not already been vaccinated. Human papillomavirus (HPV) vaccine  If recommended by your health care provider, you may need three doses over 6 months. Measles, mumps, and rubella (MMR) vaccine  You may need at least one dose of MMR. You may also need a second dose. Meningococcal conjugate (MenACWY) vaccine  One dose is recommended if you are 31-7 years old and a Market researcher living in a residence hall, or if you have one of several medical  conditions. You may also need additional booster doses. Pneumococcal conjugate (PCV13) vaccine  You may need this if you have certain conditions and were not previously vaccinated. Pneumococcal polysaccharide (PPSV23) vaccine  You may need one or two doses if you smoke cigarettes or if you have certain conditions. Hepatitis A vaccine  You may need this if you have certain conditions or if you travel or work in places where you may be exposed to hepatitis A. Hepatitis B vaccine  You may need this if you have certain conditions or if you travel or work in places where you may be exposed to hepatitis B. Haemophilus influenzae type b (Hib) vaccine  You may need this if you have certain risk factors. You may receive vaccines as individual doses or as more than one vaccine together in one shot (combination vaccines). Talk with your health care provider about the risks and benefits of combination vaccines. What tests do I need? Blood tests  Lipid and cholesterol levels. These may be checked every 5 years starting at age 37.  Hepatitis C test.  Hepatitis B test. Screening   Diabetes screening. This is done by checking your blood sugar (glucose) after you have not eaten for a while (fasting).  Sexually transmitted disease (STD) testing. Talk with your health care provider about your test results, treatment options, and if necessary, the need for more tests. Follow these instructions at home: Eating and drinking   Eat a diet that includes fresh fruits and vegetables, whole grains, lean protein, and low-fat dairy products.  Take vitamin and mineral supplements as  recommended by your health care provider.  Do not drink alcohol if your health care provider tells you not to drink.  If you drink alcohol: ? Limit how much you have to 0-2 drinks a day. ? Be aware of how much alcohol is in your drink. In the [place] one drink equals one 12 oz bottle of beer (355 mL), one 5 oz glass of wine  (148 mL), or one 1 oz glass of hard liquor (44 mL). Lifestyle  Take daily care of your teeth and gums.  Stay active. Exercise for at least 30 minutes on 5 or more days each week.  Do not use any products that contain nicotine or tobacco, such as cigarettes, e-cigarettes, and chewing tobacco. If you need help quitting, ask your health care provider.  If you are sexually active, practice safe sex. Use a condom or other form of protection to prevent STIs (sexually transmitted infections). What's next?  Go to your health care provider once a year for a well check visit.  Ask your health care provider how often you should have your eyes and teeth checked.  Stay up to date on all vaccines. This information is not intended to replace advice given to you by your health care provider. Make sure you discuss any questions you have with your health care provider. Document Released: 11/29/2001 Document Revised: 09/27/2018 Document Reviewed: 09/27/2018 Elsevier Patient Education  2020 [employer].

## 2019-10-14 NOTE — Progress Notes (Signed)
Patient: William Cowan MRN: [id number] DOB: 07-Jan-1986 PCP: William Flaming, MD     Subjective:  Chief Complaint  Patient presents with  . Annual Exam    HPI: The patient is a 33 y.o. male who presents today for annual exam. He denies any changes to past medical history. There have been no recent hospitalizations. They are following a well balanced diet and exercise plan. Weight has been stable.  Had covid around Thanksgiving. No issues. Was exercising before this time and hasn't gotten back into it.   He has a growth on his left groin he would like checked out. He noticed it a year or more ago. If he messes with it, it goes away but then it comes back. He denies any drainage. It is tender to touch. Not really growing any.   Father diagnosed with cancer, unsure what kind. Doesn't think it's colon cancer. ? Testicular?   Immunization History  Administered Date(s) Administered  . Tdap 10/11/2018   Colonoscopy: routine screening age 8 years.  Flu: declined.   Review of Systems  Constitutional: Negative for chills, fatigue and fever.  HENT: Negative for dental problem, ear pain, hearing loss and trouble swallowing.   Eyes: Negative for visual disturbance.  Respiratory: Negative for cough, chest tightness and shortness of breath.   Cardiovascular: Negative for chest pain, palpitations and leg swelling.  Gastrointestinal: Negative for abdominal pain, blood in stool, diarrhea and nausea.  Endocrine: Negative for cold intolerance, polydipsia, polyphagia and polyuria.  Genitourinary: Negative for dysuria and hematuria.  Musculoskeletal: Negative for arthralgias.  Skin: Negative for rash.       Lump in right groin.   Neurological: Negative for dizziness and headaches.  Psychiatric/Behavioral: Positive for sleep disturbance. Negative for dysphoric mood. The patient is not nervous/anxious.     Allergies Patient has No Known Allergies.  Past Medical History Patient  has a past  medical history of Chronic bronchitis ([place]), History of chicken pox, and Screening for HIV (human immunodeficiency virus).  Surgical History Patient  has no past surgical history on file.  Family History Pateint's family history includes Cancer in his maternal grandfather; Heart attack in his father; Heart failure in his father; Hyperlipidemia in his father; Hypertension in his father; Miscarriages / [place] in his mother.  Social History Patient  reports that he has been smoking cigarettes. He has never used smokeless tobacco. He reports current alcohol use. He reports current drug use. Drug: Marijuana.    Objective: Vitals:   10/14/19 0839  BP: 110/70  Pulse: 68  Temp: 97.9 F (36.6 C)  TempSrc: Skin  SpO2: 98%  Weight: 83.1 kg  Height: 6' (1.829 m)    Body mass index is 24.85 kg/m.  Physical Exam Vitals reviewed.  Constitutional:      Appearance: Normal appearance. He is well-developed and normal weight.  HENT:     Head: Normocephalic and atraumatic.     Right Ear: External ear normal. There is impacted cerumen.     Left Ear: Tympanic membrane, ear canal and external ear normal.     Nose: Nose normal.     Mouth/Throat:     Mouth: Mucous membranes are moist.  Eyes:     Extraocular Movements: Extraocular movements intact.     Conjunctiva/sclera: Conjunctivae normal.     Pupils: Pupils are equal, round, and reactive to light.  Neck:     Thyroid: No thyromegaly.  Cardiovascular:     Rate and Rhythm: Normal rate and regular rhythm.  Pulses: Normal pulses.     Heart sounds: Normal heart sounds. No murmur.  Pulmonary:     Effort: Pulmonary effort is normal.     Breath sounds: Normal breath sounds.  Abdominal:     General: Abdomen is flat. Bowel sounds are normal. There is no distension.     Palpations: Abdomen is soft.     Tenderness: There is no abdominal tenderness.  Musculoskeletal:     Cervical back: Normal range of motion and neck supple.   Lymphadenopathy:     Cervical: No cervical adenopathy.  Skin:    General: Skin is warm and dry.     Findings: No rash.     Comments: Left groin: superficial, mobile mass. About 2 cm in diameter. Well circumcised borders. No erythema or edema. Not warm to touch.   Neurological:     General: No focal deficit present.     Mental Status: He is alert and oriented to person, place, and time.     Cranial Nerves: No cranial nerve deficit.     Coordination: Coordination normal.     Deep Tendon Reflexes: Reflexes normal.  Psychiatric:        Mood and Affect: Mood normal.        Behavior: Behavior normal.       Assessment/plan:  1. Annual physical exam Routine labs today. He works nights and had some cereal a few hours ago. Doing well otherwise. Declines flu, but otherwise up to date on HM. Asked that he find out what kind of cancer his father has so we know for his health/screenings. Continue to try and exercise/healthy diet. Encouraged smoking cessation. Doing more vaping and black and milds.  F/u in one year or as needed.  Patient counseling [x]    Nutrition: Stressed importance of moderation in sodium/caffeine intake, saturated fat and cholesterol, caloric balance, sufficient intake of fresh fruits, vegetables, fiber, calcium, iron, and 1 mg of folate supplement per day (for females capable of pregnancy).  [x]    Stressed the importance of regular exercise.   [x]    Substance Abuse: Discussed cessation/primary prevention of tobacco, alcohol, or other drug use; driving or other dangerous activities under the influence; availability of treatment for abuse.   [x]    Injury prevention: Discussed safety belts, safety helmets, smoke detector, smoking near bedding or upholstery.   [x]    Sexuality: Discussed sexually transmitted diseases, partner selection, use of condoms, avoidance of unintended pregnancy  and contraceptive alternatives.  [x]    Dental health: Discussed importance of regular tooth  brushing, flossing, and dental visits.  [x]    Health maintenance and immunizations reviewed. Please refer to Health maintenance section.    - Comprehensive metabolic panel - CBC with Differential/Platelet - TSH - VITAMIN D 25 Hydroxy (Vit-D Deficiency, Fractures) - Lipid panel  2. Epidermoid cyst Not inflamed or irritated. Will need full excision. Referral to surgery. Let me know if he does not hear from them.  - Ambulatory referral to General Surgery    This visit occurred during the SARS-CoV-2 public health emergency.  Safety protocols were in place, including screening questions prior to the visit, additional usage of staff PPE, and extensive cleaning of exam room while observing appropriate contact time as indicated for disinfecting solutions.    Return in about 1 year (around 10/13/2020).    , MD [facility] [place]  10/14/2019

## 2019-10-16 ENCOUNTER — Other Ambulatory Visit: Payer: Self-pay | Admitting: Family Medicine

## 2019-10-16 DIAGNOSIS — E559 Vitamin D deficiency, unspecified: Secondary | ICD-10-CM

## 2019-10-16 MED ORDER — VITAMIN D (ERGOCALCIFEROL) 1.25 MG (50000 UNIT) PO CAPS: ORAL_CAPSULE | ORAL | 0 refills | Status: DC

## 2019-10-16 NOTE — Progress Notes (Signed)
v

## 2019-11-15 ENCOUNTER — Telehealth: Payer: Self-pay

## 2019-11-15 NOTE — Telephone Encounter (Signed)
Received incoming call from patient regarding lab results form 10/14/19.  Lab results reviewed with patient as well as notes per Dr. Artis Flock.  He verbalized understanding.

## 2019-11-28 DIAGNOSIS — L0291 Cutaneous abscess, unspecified: Secondary | ICD-10-CM | POA: Diagnosis not present

## 2019-12-13 ENCOUNTER — Other Ambulatory Visit: Payer: Self-pay | Admitting: Surgery

## 2019-12-13 DIAGNOSIS — L723 Sebaceous cyst: Secondary | ICD-10-CM | POA: Diagnosis not present

## 2019-12-13 DIAGNOSIS — L089 Local infection of the skin and subcutaneous tissue, unspecified: Secondary | ICD-10-CM | POA: Diagnosis not present

## 2019-12-24 ENCOUNTER — Encounter (HOSPITAL_BASED_OUTPATIENT_CLINIC_OR_DEPARTMENT_OTHER): Payer: Self-pay | Admitting: Surgery

## 2019-12-24 ENCOUNTER — Other Ambulatory Visit: Payer: Self-pay

## 2019-12-26 ENCOUNTER — Other Ambulatory Visit (HOSPITAL_COMMUNITY)
Admission: RE | Admit: 2019-12-26 | Discharge: 2019-12-26 | Disposition: A | Discharge status: Home / Self Care | Payer: BC Managed Care – PPO | Source: Ambulatory Visit | Attending: Surgery | Admitting: Surgery

## 2019-12-26 DIAGNOSIS — Z01812 Encounter for preprocedural laboratory examination: Secondary | ICD-10-CM | POA: Insufficient documentation

## 2019-12-26 DIAGNOSIS — Z20822 Contact with and (suspected) exposure to covid-19: Secondary | ICD-10-CM | POA: Insufficient documentation

## 2019-12-26 LAB — SARS CORONAVIRUS 2 (TAT 6-24 HRS): SARS Coronavirus 2: NEGATIVE

## 2019-12-27 NOTE — Progress Notes (Signed)

## 2019-12-29 NOTE — H&P (Signed)
  William Cowan Documented: 12/13/2019 10:31 AM Location: [facility] Patient #: [id number] DOB: [birth year] Single / Language: [name] / Race: Black or African American Male   History of Present Illness ([name] A. Magnus Ivan MD; 12/13/2019 10:41 AM) The patient is a 34 year old male who presents for wound check. He is here today for a follow-up of his left groin conically infected sebaceous cyst  This is a 34 year old gentleman who has been seen in our urgent clinic for infected sebaceous cyst in his left inguinal area/groin. It has spontaneously drained at least twice. He reports it is now healed over. He has had the cyst in the groin for more than a year but has only recently become infected. He is otherwise healthy. Per his report, he did have Covid late last year. He does not have diabetes and is not a smoker.   Allergies ([name], RMA; 12/13/2019 10:31 AM) No Known Allergies [11/28/2019]: No Known Drug Allergies [11/28/2019]: Allergies Reconciled   Medication History ([name], RMA; 12/13/2019 10:31 AM) No Current Medications Medications Reconciled  Vitals ([name] RMA; 12/13/2019 10:32 AM) 12/13/2019 10:32 AM Weight: 183.8 lb Height: 74in Body Surface Area: 2.1 m Body Mass Index: 23.6 kg/m  Temp.: 80F  Pulse: 82 (Regular)  BP: 122/72 (Sitting, Left Arm, Standard)       Physical Exam ([name] A. Magnus Ivan MD; 12/13/2019 10:41 AM) The physical exam findings are as follows: Note:On exam, he is well-appearing. The sebaceous cyst is still present in the left inguinal area. The wound is closed. There is still some mild induration.    Assessment & Plan (General Wearing A. Magnus Ivan MD; 12/13/2019 10:43 AM) INFECTED SEBACEOUS CYST (L72.3) Impression: We discussed the diagnosis of a chronically infected sebaceous cyst. Without surgical excision, this will continue to intermittently become infected and be a problem. We discussed  complete surgical excision versus continued incision and drainage procedures with infected. We discussed that surgery would be curative for this area more than likely. I discussed the surgical procedure in detail. I discussed the risks of bleeding, infection, chronic pain, wound breakdown, cardiopulmonary issues, etc. I believe this can be done as outpatient surgery. He understands and wishes to proceed with surgical excision which will be scheduled. Current Plans Started Doxycycline Hyclate 100 MG Oral Capsule, 1 (one) Capsule two times daily, #14, 12/13/2019, No Refill.

## 2019-12-30 ENCOUNTER — Ambulatory Visit (HOSPITAL_BASED_OUTPATIENT_CLINIC_OR_DEPARTMENT_OTHER): Payer: BC Managed Care – PPO | Admitting: Certified Registered Nurse Anesthetist

## 2019-12-30 ENCOUNTER — Other Ambulatory Visit: Payer: Self-pay

## 2019-12-30 ENCOUNTER — Encounter (HOSPITAL_BASED_OUTPATIENT_CLINIC_OR_DEPARTMENT_OTHER): Payer: Self-pay | Admitting: Surgery

## 2019-12-30 ENCOUNTER — Ambulatory Visit (HOSPITAL_BASED_OUTPATIENT_CLINIC_OR_DEPARTMENT_OTHER)
Admission: RE | Admit: 2019-12-30 | Discharge: 2019-12-30 | Disposition: A | Discharge status: Home / Self Care | Payer: BC Managed Care – PPO | Attending: Surgery | Admitting: Surgery

## 2019-12-30 ENCOUNTER — Encounter (HOSPITAL_BASED_OUTPATIENT_CLINIC_OR_DEPARTMENT_OTHER)
Admission: RE | Disposition: A | Discharge status: Home / Self Care | Payer: Self-pay | Source: Home / Self Care | Attending: Surgery

## 2019-12-30 DIAGNOSIS — E559 Vitamin D deficiency, unspecified: Secondary | ICD-10-CM | POA: Diagnosis not present

## 2019-12-30 DIAGNOSIS — Z8616 Personal history of COVID-19: Secondary | ICD-10-CM | POA: Diagnosis not present

## 2019-12-30 DIAGNOSIS — L723 Sebaceous cyst: Secondary | ICD-10-CM | POA: Diagnosis not present

## 2019-12-30 DIAGNOSIS — Z87891 Personal history of nicotine dependence: Secondary | ICD-10-CM | POA: Diagnosis not present

## 2019-12-30 DIAGNOSIS — L089 Local infection of the skin and subcutaneous tissue, unspecified: Secondary | ICD-10-CM | POA: Diagnosis not present

## 2019-12-30 HISTORY — PX: CYST EXCISION: SHX5701

## 2019-12-30 SURGERY — CYST REMOVAL
Anesthesia: General | Site: Groin | Laterality: Left

## 2019-12-30 MED ORDER — DEXAMETHASONE SODIUM PHOSPHATE 10 MG/ML IJ SOLN
INTRAMUSCULAR | Status: DC | PRN
  Administered 2019-12-30: 5 mg via INTRAVENOUS

## 2019-12-30 MED ORDER — CEFAZOLIN SODIUM-DEXTROSE 2-4 GM/100ML-% IV SOLN
2.0000 g | INTRAVENOUS | Status: AC
  Administered 2019-12-30: 2 g via INTRAVENOUS

## 2019-12-30 MED ORDER — FENTANYL CITRATE (PF) 100 MCG/2ML IJ SOLN
INTRAMUSCULAR | Status: AC
  Filled 2019-12-30: qty 2

## 2019-12-30 MED ORDER — CHLORHEXIDINE GLUCONATE CLOTH 2 % EX PADS: 6.0000 | MEDICATED_PAD | Freq: Once | CUTANEOUS | Status: DC

## 2019-12-30 MED ORDER — ACETAMINOPHEN 500 MG PO TABS
ORAL_TABLET | ORAL | Status: AC
  Filled 2019-12-30: qty 2

## 2019-12-30 MED ORDER — MIDAZOLAM HCL 2 MG/2ML IJ SOLN
INTRAMUSCULAR | Status: AC
  Filled 2019-12-30: qty 2

## 2019-12-30 MED ORDER — DEXAMETHASONE SODIUM PHOSPHATE 10 MG/ML IJ SOLN
INTRAMUSCULAR | Status: AC
  Filled 2019-12-30: qty 1

## 2019-12-30 MED ORDER — ACETAMINOPHEN 500 MG PO TABS
1000.0000 mg | ORAL_TABLET | ORAL | Status: AC
  Administered 2019-12-30: 1000 mg via ORAL

## 2019-12-30 MED ORDER — OXYCODONE HCL 5 MG/5ML PO SOLN: 5.0000 mg | Freq: Once | ORAL | Status: DC | PRN

## 2019-12-30 MED ORDER — GABAPENTIN 300 MG PO CAPS
ORAL_CAPSULE | ORAL | Status: AC
  Filled 2019-12-30: qty 1

## 2019-12-30 MED ORDER — CELECOXIB 200 MG PO CAPS
ORAL_CAPSULE | ORAL | Status: AC
  Filled 2019-12-30: qty 2

## 2019-12-30 MED ORDER — LIDOCAINE HCL (CARDIAC) PF 100 MG/5ML IV SOSY
PREFILLED_SYRINGE | INTRAVENOUS | Status: DC | PRN
  Administered 2019-12-30: 80 mg via INTRATRACHEAL

## 2019-12-30 MED ORDER — MIDAZOLAM HCL 2 MG/2ML IJ SOLN
INTRAMUSCULAR | Status: DC | PRN
  Administered 2019-12-30: 2 mg via INTRAVENOUS

## 2019-12-30 MED ORDER — ENSURE PRE-SURGERY PO LIQD: 296.0000 mL | Freq: Once | ORAL | Status: DC

## 2019-12-30 MED ORDER — CELECOXIB 400 MG PO CAPS
400.0000 mg | ORAL_CAPSULE | ORAL | Status: AC
  Administered 2019-12-30: 400 mg via ORAL

## 2019-12-30 MED ORDER — FENTANYL CITRATE (PF) 250 MCG/5ML IJ SOLN
INTRAMUSCULAR | Status: DC | PRN
  Administered 2019-12-30: 100 ug via INTRAVENOUS

## 2019-12-30 MED ORDER — TRAMADOL HCL 50 MG PO TABS: 50.0000 mg | ORAL_TABLET | Freq: Four times a day (QID) | ORAL | 0 refills | Status: DC | PRN

## 2019-12-30 MED ORDER — CEFAZOLIN SODIUM-DEXTROSE 2-4 GM/100ML-% IV SOLN
INTRAVENOUS | Status: AC
  Filled 2019-12-30: qty 100

## 2019-12-30 MED ORDER — OXYCODONE HCL 5 MG PO TABS: 5.0000 mg | ORAL_TABLET | Freq: Once | ORAL | Status: DC | PRN

## 2019-12-30 MED ORDER — PROPOFOL 10 MG/ML IV BOLUS
INTRAVENOUS | Status: DC | PRN
  Administered 2019-12-30: 200 mg via INTRAVENOUS

## 2019-12-30 MED ORDER — LACTATED RINGERS IV SOLN: INTRAVENOUS | Status: DC

## 2019-12-30 MED ORDER — BUPIVACAINE HCL (PF) 0.5 % IJ SOLN
INTRAMUSCULAR | Status: DC | PRN
  Administered 2019-12-30: 10 mL

## 2019-12-30 MED ORDER — FENTANYL CITRATE (PF) 100 MCG/2ML IJ SOLN: 25.0000 ug | INTRAMUSCULAR | Status: DC | PRN

## 2019-12-30 MED ORDER — GABAPENTIN 300 MG PO CAPS
300.0000 mg | ORAL_CAPSULE | ORAL | Status: AC
  Administered 2019-12-30: 300 mg via ORAL

## 2019-12-30 MED ORDER — ONDANSETRON HCL 4 MG/2ML IJ SOLN: 4.0000 mg | Freq: Once | INTRAMUSCULAR | Status: DC | PRN

## 2019-12-30 MED ORDER — ONDANSETRON HCL 4 MG/2ML IJ SOLN
INTRAMUSCULAR | Status: DC | PRN
  Administered 2019-12-30: 4 mg via INTRAVENOUS

## 2019-12-30 MED ORDER — ONDANSETRON HCL 4 MG/2ML IJ SOLN
INTRAMUSCULAR | Status: AC
  Filled 2019-12-30: qty 2

## 2019-12-30 SURGICAL SUPPLY — 38 items
BLADE SURG 15 STRL LF DISP TIS (BLADE) ×1 IMPLANT
BLADE SURG 15 STRL SS (BLADE) ×2
CANISTER SUCT 1200ML W/VALVE (MISCELLANEOUS) IMPLANT
CHLORAPREP W/TINT 26 (MISCELLANEOUS) ×3 IMPLANT
COVER BACK TABLE 60X90IN (DRAPES) ×3 IMPLANT
COVER MAYO STAND STRL (DRAPES) ×3 IMPLANT
COVER WAND RF STERILE (DRAPES) IMPLANT
DECANTER SPIKE VIAL GLASS SM (MISCELLANEOUS) IMPLANT
DERMABOND ADVANCED (GAUZE/BANDAGES/DRESSINGS) ×2
DERMABOND ADVANCED .7 DNX12 (GAUZE/BANDAGES/DRESSINGS) ×1 IMPLANT
DRAPE LAPAROTOMY 100X72 PEDS (DRAPES) ×3 IMPLANT
DRAPE UTILITY XL STRL (DRAPES) ×3 IMPLANT
ELECT REM PT RETURN 9FT ADLT (ELECTROSURGICAL) ×3
ELECTRODE REM PT RTRN 9FT ADLT (ELECTROSURGICAL) ×1 IMPLANT
GLOVE BIO SURGEON STRL SZ7 (GLOVE) ×3 IMPLANT
GLOVE BIOGEL PI IND STRL 7.0 (GLOVE) ×1 IMPLANT
GLOVE BIOGEL PI IND STRL 7.5 (GLOVE) ×1 IMPLANT
GLOVE BIOGEL PI INDICATOR 7.0 (GLOVE) ×2
GLOVE BIOGEL PI INDICATOR 7.5 (GLOVE) ×2
GLOVE SURG SIGNA 7.5 PF LTX (GLOVE) ×3 IMPLANT
GOWN STRL REUS W/ TWL LRG LVL3 (GOWN DISPOSABLE) IMPLANT
GOWN STRL REUS W/ TWL XL LVL3 (GOWN DISPOSABLE) ×2 IMPLANT
GOWN STRL REUS W/TWL LRG LVL3 (GOWN DISPOSABLE)
GOWN STRL REUS W/TWL XL LVL3 (GOWN DISPOSABLE) ×4
NEEDLE HYPO 25X1 1.5 SAFETY (NEEDLE) ×3 IMPLANT
NS IRRIG 1000ML POUR BTL (IV SOLUTION) ×3 IMPLANT
PACK BASIN DAY SURGERY FS (CUSTOM PROCEDURE TRAY) ×3 IMPLANT
PENCIL SMOKE EVACUATOR (MISCELLANEOUS) ×3 IMPLANT
SLEEVE SCD COMPRESS KNEE MED (MISCELLANEOUS) IMPLANT
SPONGE LAP 4X18 RFD (DISPOSABLE) ×3 IMPLANT
SUT MNCRL AB 4-0 PS2 18 (SUTURE) ×3 IMPLANT
SUT VIC AB 3-0 SH 27 (SUTURE) ×2
SUT VIC AB 3-0 SH 27X BRD (SUTURE) ×1 IMPLANT
SYR CONTROL 10ML LL (SYRINGE) ×3 IMPLANT
TOWEL GREEN STERILE FF (TOWEL DISPOSABLE) ×3 IMPLANT
TUBE CONNECTING 20'X1/4 (TUBING)
TUBE CONNECTING 20X1/4 (TUBING) IMPLANT
YANKAUER SUCT BULB TIP NO VENT (SUCTIONS) IMPLANT

## 2019-12-30 NOTE — Discharge Instructions (Signed)
It is okay to work tomorrow if you are not taking narcotics.  You may take ibuprofen, Tylenol, and use an ice pack for pain  You may shower starting tomorrow  No vigorous activity for 1 week  If you need any notes for return to work, please call our office at [phone]   Post Anesthesia Home Care Instructions  Activity: Get plenty of rest for the remainder of the day. A responsible individual must stay with you for 24 hours following the procedure.  For the next 24 hours, DO NOT: -Drive a car -Advertising copywriter -Drink alcoholic beverages -Take any medication unless instructed by your physician -Make any legal decisions or sign important papers.  Meals: Start with liquid foods such as gelatin or soup. Progress to regular foods as tolerated. Avoid greasy, spicy, heavy foods. If nausea and/or vomiting occur, drink only clear liquids until the nausea and/or vomiting subsides. Call your physician if vomiting continues.  Special Instructions/Symptoms: Your throat may feel dry or sore from the anesthesia or the breathing tube placed in your throat during surgery. If this causes discomfort, gargle with warm salt water. The discomfort should disappear within 24 hours.  May have Tylenol after 5pm May have after Ibuprofen after 7pm

## 2019-12-30 NOTE — Transfer of Care (Signed)
Immediate Anesthesia Transfer of Care Note  Patient: William Cowan  Procedure(s) Performed: EXCISION OF LEFT GROIN CHRONIC SEBACEOUS CYST (Left Groin)  Patient Location: PACU  Anesthesia Type:General  Level of Consciousness: drowsy and patient cooperative  Airway & Oxygen Therapy: Patient Spontanous Breathing and Patient connected to face mask oxygen  Post-op Assessment: Report given to RN and Post -op Vital signs reviewed and stable  Post vital signs: Reviewed and stable  Last Vitals:  Vitals Value Taken Time  BP 120/84 12/30/19 1236  Temp    Pulse 69 12/30/19 1236  Resp 14 12/30/19 1236  SpO2 100 % 12/30/19 1236  Vitals shown include unvalidated device data.  Last Pain:  Vitals:   12/30/19 1033  TempSrc: Tympanic  PainSc: 0-No pain         Complications: No apparent anesthesia complications

## 2019-12-30 NOTE — Op Note (Signed)
EXCISION OF LEFT GROIN CHRONIC SEBACEOUS CYST  Procedure Note  Taurus Alamo 12/30/2019   Pre-op Diagnosis: CHRONICALLY INFECTED LEFT GROIN SEBACEOUS CYST     Post-op Diagnosis: same  Procedure(s): EXCISION OF LEFT GROIN CHRONIC SEBACEOUS CYST (2.5 CM)  Surgeon(s): [name], MD  Anesthesia: General  Staff:  Circulator: Lenn Cal, RN Scrub Person: Monika Salk, CST  Estimated Blood Loss: Minimal               Specimens: sent to path  Procedure: The patient was brought to the operating room and identifies correct patient.  He was placed upon the operating table and anesthesia was induced.  His left groin was then prepped and draped in the usual sterile fashion.  I anesthetized the skin around the cyst and previous scar with Marcaine.  I then made an elliptical incision with a scalpel.  I excised the cyst in its entirety including the overlying skin and subcutaneous tissue with the electrocautery.  The cyst did not appear actively infected at this point.  It was sent to pathology for evaluation.  I achieved hemostasis with the cautery.  The cyst itself was approximately 2.5 cm in size.  I closed the subcutaneous tissue with interrupted 3-0 Vicryl sutures and closed the skin with a running 4-0 Monocryl.  Dermabond was then applied.  The patient tolerated the procedure well.  All the counts were correct at the end of the procedure.  The patient was then extubated in the operating room and taken in stable condition to the recovery room.          [name]   Date: 12/30/2019  Time: 12:31 PM

## 2019-12-30 NOTE — Anesthesia Procedure Notes (Signed)
Procedure Name: LMA Insertion Date/Time: 12/30/2019 12:14 PM Performed by: Modena Morrow, CRNA Pre-anesthesia Checklist: Patient identified, Emergency Drugs available, Suction available and Patient being monitored Patient Re-evaluated:Patient Re-evaluated prior to induction Oxygen Delivery Method: Circle system utilized Preoxygenation: Pre-oxygenation with 100% oxygen Induction Type: IV induction Ventilation: Mask ventilation without difficulty LMA: LMA inserted LMA Size: 5.0 Number of attempts: 1 Placement Confirmation: breath sounds checked- equal and bilateral and positive ETCO2 Tube secured with: Tape Dental Injury: Teeth and Oropharynx as per pre-operative assessment

## 2019-12-30 NOTE — Anesthesia Preprocedure Evaluation (Signed)
Anesthesia Evaluation  Patient identified by MRN, date of birth, ID band Patient awake    Reviewed: Allergy & Precautions, NPO status , Patient's Chart, lab work & pertinent test results  Airway Mallampati: II  TM Distance: >3 FB Neck ROM: Full    Dental no notable dental hx. (+) Teeth Intact   Pulmonary former smoker,    Pulmonary exam normal breath sounds clear to auscultation       Cardiovascular negative cardio ROS Normal cardiovascular exam Rhythm:Regular Rate:Normal     Neuro/Psych negative neurological ROS  negative psych ROS   GI/Hepatic negative GI ROS, Neg liver ROS,   Endo/Other  Vitamin D deficiency  Renal/GU negative Renal ROS  negative genitourinary   Musculoskeletal Left Groin infected sebaceous cyst   Abdominal   Peds  Hematology negative hematology ROS (+)   Anesthesia Other Findings   Reproductive/Obstetrics                             Anesthesia Physical Anesthesia Plan  ASA: II  Anesthesia Plan: General   Post-op Pain Management:    Induction: Intravenous  PONV Risk Score and Plan: 2 and Ondansetron, Treatment may vary due to age or medical condition and Dexamethasone  Airway Management Planned: LMA  Additional Equipment:   Intra-op Plan:   Post-operative Plan: Extubation in OR  Informed Consent: I have reviewed the patients History and Physical, chart, labs and discussed the procedure including the risks, benefits and alternatives for the proposed anesthesia with the patient or authorized representative who has indicated his/her understanding and acceptance.     Dental advisory given  Plan Discussed with: CRNA and Surgeon  Anesthesia Plan Comments:         Anesthesia Quick Evaluation

## 2019-12-30 NOTE — Addendum Note (Signed)
Addendum  created 12/30/19 1325 by Mal Amabile, MD   Order list changed

## 2019-12-30 NOTE — Interval H&P Note (Signed)
History and Physical Interval Note:no change in H and P  12/30/2019 11:55 AM  William Cowan  has presented today for surgery, with the diagnosis of INFECTED LEFT GROIN SEBACEOUS CYST.  The various methods of treatment have been discussed with the patient and family. After consideration of risks, benefits and other options for treatment, the patient has consented to  Procedure(s): EXCISION OF LEFT GROIN CHRONIC SEBACEOUS CYST (Left) as a surgical intervention.  The patient's history has been reviewed, patient examined, no change in status, stable for surgery.  I have reviewed the patient's chart and labs.  Questions were answered to the patient's satisfaction.     [name]

## 2019-12-30 NOTE — Anesthesia Postprocedure Evaluation (Signed)
Anesthesia Post Note  Patient: William Cowan  Procedure(s) Performed: EXCISION OF LEFT GROIN CHRONIC SEBACEOUS CYST (Left Groin)     Patient location during evaluation: PACU Anesthesia Type: General Level of consciousness: awake and alert and oriented Pain management: pain level controlled Vital Signs Assessment: post-procedure vital signs reviewed and stable Respiratory status: spontaneous breathing, nonlabored ventilation and respiratory function stable Cardiovascular status: blood pressure returned to baseline and stable Postop Assessment: no apparent nausea or vomiting Anesthetic complications: no    Last Vitals:  Vitals:   12/30/19 1245 12/30/19 1300  BP: 133/89 125/82  Pulse: 80 74  Resp: 16 15  Temp:    SpO2: 100% 100%    Last Pain:  Vitals:   12/30/19 1300  TempSrc:   PainSc: 0-No pain                 Soriah Leeman A.

## 2019-12-31 ENCOUNTER — Encounter: Payer: Self-pay | Admitting: *Deleted

## 2019-12-31 LAB — SURGICAL PATHOLOGY

## 2020-01-20 DIAGNOSIS — H40013 Open angle with borderline findings, low risk, bilateral: Secondary | ICD-10-CM | POA: Diagnosis not present

## 2020-07-22 DIAGNOSIS — H40013 Open angle with borderline findings, low risk, bilateral: Secondary | ICD-10-CM | POA: Diagnosis not present

## 2020-08-18 ENCOUNTER — Telehealth: Payer: Self-pay

## 2020-08-18 NOTE — Telephone Encounter (Signed)
Pt is experiencing bad headaches and sharp pains in his head. Can I schedule him in the next sameday slot?

## 2020-08-19 NOTE — Telephone Encounter (Signed)
Called pt to schedule. No voicemail set up. Will call again

## 2020-08-19 NOTE — Telephone Encounter (Signed)
Pt is scheduled °

## 2020-08-19 NOTE — Telephone Encounter (Signed)
Yes! aw

## 2020-08-20 ENCOUNTER — Encounter: Payer: Self-pay | Admitting: Family Medicine

## 2020-08-20 ENCOUNTER — Ambulatory Visit (HOSPITAL_COMMUNITY)
Admission: RE | Admit: 2020-08-20 | Discharge: 2020-08-20 | Disposition: A | Discharge status: Home / Self Care | Payer: BC Managed Care – PPO | Source: Ambulatory Visit | Attending: Family Medicine | Admitting: Family Medicine

## 2020-08-20 ENCOUNTER — Other Ambulatory Visit: Payer: Self-pay

## 2020-08-20 ENCOUNTER — Ambulatory Visit (INDEPENDENT_AMBULATORY_CARE_PROVIDER_SITE_OTHER): Payer: BC Managed Care – PPO | Admitting: Family Medicine

## 2020-08-20 DIAGNOSIS — R519 Headache, unspecified: Secondary | ICD-10-CM | POA: Diagnosis not present

## 2020-08-20 DIAGNOSIS — G4453 Primary thunderclap headache: Secondary | ICD-10-CM | POA: Insufficient documentation

## 2020-08-20 MED ORDER — KETOROLAC TROMETHAMINE 60 MG/2ML IM SOLN
60.0000 mg | Freq: Once | INTRAMUSCULAR | Status: AC
  Administered 2020-08-20: 60 mg via INTRAMUSCULAR

## 2020-08-20 NOTE — Progress Notes (Signed)
Patient: William Cowan MRN: [id number] DOB: [birth year] PCP: Orland Mustard, MD     Subjective:  Chief Complaint  Patient presents with  . Headache    started a week ago, left side of throat went to ear and now left side of head. He has tried Tylenol, Aleve, Goody Powder no relief.    HPI: The patient is a 34 y.o. male who presents today for headache that started a week ago. He started to feel some bumps on top of his head and started to feel shock like waves going down the left side of his head. Feels like electricity down the side of his head. He has pressure behind his left eye and left forehead. Pain rated as a 6/10 and is constant. If he is moving a lot he will feel it more often. He denies any vision changes or blurry vision. Mild photophobia due to pressure. He states he feels the headache more around loud noise. No nausea/vomiting. He has no history of headaches. He does smoke MJ, no excess use lately. He has tried aleve and goody powder with no relief. He also had some swelling in his left neck that is better. Headache does not wake him up in the middle of the night, no focal deficits and he does state it was thunderclap in nature.   -sleeping better than he has been. Possibly more alcohol this week than normal.   He thinks his uncle had a brain aneurysm and stroke in his 59s.   Review of Systems  Constitutional: Negative for diaphoresis, fatigue and fever.  HENT: Negative for congestion, sinus pressure and sinus pain.   Respiratory: Negative for choking and shortness of breath.   Cardiovascular: Negative for chest pain and palpitations.  Gastrointestinal: Negative for abdominal pain, diarrhea, nausea and vomiting.  Skin: Positive for wound (bumps on head/forehead).  Neurological: Positive for headaches. Negative for dizziness, facial asymmetry, speech difficulty, light-headedness and numbness.  Hematological: Positive for adenopathy (left side of neck ).     Allergies Patient has No Known Allergies.  Past Medical History Patient  has a past medical history of Chronic bronchitis (HCC), History of chicken pox, and Screening for HIV (human immunodeficiency virus).  Surgical History Patient  has a past surgical history that includes Cyst excision (Left, 12/30/2019).  Family History Pateint's family history includes Cancer in his maternal grandfather; Heart attack in his father; Heart failure in his father; Hyperlipidemia in his father; Hypertension in his father; Miscarriages / [place] in his mother.  Social History Patient  reports that he has quit smoking. His smoking use included cigarettes. He has never used smokeless tobacco. He reports current alcohol use. He reports previous drug use. Drug: Marijuana.    Objective: Vitals:   08/20/20 0820  BP: 110/80  Pulse: 86  Temp: 98.7 F (37.1 C)  TempSrc: Temporal  SpO2: 97%  Weight: 214 lb 6.1 oz (97.2 kg)  Height: 6\' 2"  (1.88 m)    Body mass index is 27.52 kg/m.  Physical Exam     Assessment/plan: 1. Primary thunderclap headache Stat CT of head since thunderclap in nature although no neurodeficits appreciated on exam. Discussed if CT negative can come back for toradol shot and handout given on tension headaches. Appears to have some mild lymphadenopathy of left submandibular vs. Anterior cervical chain nodes that has improved. ? If viral trigger of headache.  - CT Head Wo Contrast; Future  -bumps on face are cystic acne.   -CT head negative. Came back and  I gave him a toradol shot.   This visit occurred during the SARS-CoV-2 public health emergency.  Safety protocols were in place, including screening questions prior to the visit, additional usage of staff PPE, and extensive cleaning of exam room while observing appropriate contact time as indicated for disinfecting solutions.    No follow-ups on file.     Orland Mustard, MD [facility]  08/20/2020

## 2020-08-20 NOTE — Patient Instructions (Signed)
Stat CT of your head today to rule out bleed or other acute causes of a thunderclap headache.  If negative can come back here for toradol shot.  I would recommend motrin 600-800mg / with food as needed for headache. Do not want you on this long term.  Decrease alcohol and MJ, may help some.   Tension Headache, Adult A tension headache is pain, pressure, or aching in your head. Tension headaches can last from 30 minutes to several days. Follow these instructions at home: Managing pain  Take over-the-counter and prescription medicines only as told by your doctor.  When you have a headache, lie down in a dark, quiet room.  If told, put ice on your head and neck: ? Put ice in a plastic bag. ? Place a towel between your skin and the bag. ? Leave the ice on for 20 minutes, 2-3 times a day.  If told, put heat on the back of your neck. Do this as often as your doctor tells you to. Use the kind of heat that your doctor recommends, such as a moist heat pack or a heating pad. ? Place a towel between your skin and the heat. ? Leave the heat on for 20-30 minutes. ? Remove the heat if your skin turns bright red. Eating and drinking  Eat meals on a regular schedule.  Watch how much alcohol you drink: ? If you are a woman and are not pregnant, do not drink more than 1 drink a day. ? If you are a man, do not drink more than 2 drinks a day.  Drink enough fluid to keep your pee (urine) pale yellow.  Do not use a lot of caffeine, or stop using caffeine. Lifestyle  Get enough sleep. Get 7-9 hours of sleep each night. Or get the amount of sleep that your doctor tells you to.  At bedtime, remove all electronic devices from your room. Examples of electronic devices are computers, phones, and tablets.  Find ways to lessen your stress. Some things that can lessen stress are: ? Exercise. ? Deep breathing. ? Yoga. ? Music. ? Positive thoughts.  Sit up straight. Do not tighten (tense) your  muscles.  Do not use any products that have nicotine or tobacco in them, such as cigarettes and e-cigarettes. If you need help quitting, ask your doctor. General instructions   Keep all follow-up visits as told by your doctor. This is important.  Avoid things that can bring on headaches. Keep a journal to find out if certain things bring on headaches. For example, write down: ? What you eat and drink. ? How much sleep you get. ? Any change to your diet or medicines. Contact a doctor if:  Your headache does not get better.  Your headache comes back.  You have a headache and sounds, light, or smells bother you.  You feel sick to your stomach (nauseous) or you throw up (vomit).  Your stomach hurts. Get help right away if:  You suddenly get a very bad headache along with any of these: ? A stiff neck. ? Feeling sick to your stomach. ? Throwing up. ? Feeling weak. ? Trouble seeing. ? Feeling short of breath. ? A rash. ? Feeling unusually sleepy. ? Trouble speaking. ? Pain in your eye or ear. ? Trouble walking or balancing. ? Feeling like you will pass out (faint). ? Passing out. Summary  A tension headache is pain, pressure, or aching in your head.  Tension headaches can last from  30 minutes to several days.  Lifestyle changes and medicines may help relieve pain. This information is not intended to replace advice given to you by your health care provider. Make sure you discuss any questions you have with your health care provider. Document Revised: 07/31/2019 Document Reviewed: 01/13/2017 Elsevier Patient Education  2020 [employer].

## 2020-10-14 ENCOUNTER — Ambulatory Visit (INDEPENDENT_AMBULATORY_CARE_PROVIDER_SITE_OTHER): Payer: BC Managed Care – PPO | Admitting: Family Medicine

## 2020-10-14 ENCOUNTER — Encounter: Payer: Self-pay | Admitting: Family Medicine

## 2020-10-14 ENCOUNTER — Other Ambulatory Visit: Payer: Self-pay

## 2020-10-14 VITALS — BP 112/82 | HR 93 | Temp 98.4°F | Ht 74.0 in | Wt 207.2 lb

## 2020-10-14 DIAGNOSIS — Z1159 Encounter for screening for other viral diseases: Secondary | ICD-10-CM

## 2020-10-14 DIAGNOSIS — Z0000 Encounter for general adult medical examination without abnormal findings: Secondary | ICD-10-CM

## 2020-10-14 DIAGNOSIS — E559 Vitamin D deficiency, unspecified: Secondary | ICD-10-CM | POA: Diagnosis not present

## 2020-10-14 LAB — CBC WITH DIFFERENTIAL/PLATELET
Basophils Absolute: 0 10*3/uL (ref 0.0–0.1)
Basophils Relative: 0.9 % (ref 0.0–3.0)
Eosinophils Absolute: 0.1 10*3/uL (ref 0.0–0.7)
Eosinophils Relative: 2.2 % (ref 0.0–5.0)
HCT: 44.9 % (ref 39.0–52.0)
Hemoglobin: 15.1 g/dL (ref 13.0–17.0)
Lymphocytes Relative: 35.1 % (ref 12.0–46.0)
Lymphs Abs: 1.7 10*3/uL (ref 0.7–4.0)
MCHC: 33.7 g/dL (ref 30.0–36.0)
MCV: 91.4 fl (ref 78.0–100.0)
Monocytes Absolute: 0.7 10*3/uL (ref 0.1–1.0)
Monocytes Relative: 14.8 % — ABNORMAL HIGH (ref 3.0–12.0)
Neutro Abs: 2.3 10*3/uL (ref 1.4–7.7)
Neutrophils Relative %: 47 % (ref 43.0–77.0)
Platelets: 299 10*3/uL (ref 150.0–400.0)
RBC: 4.9 Mil/uL (ref 4.22–5.81)
RDW: 12.8 % (ref 11.5–15.5)
WBC: 4.8 10*3/uL (ref 4.0–10.5)

## 2020-10-14 LAB — COMPREHENSIVE METABOLIC PANEL
ALT: 30 U/L (ref 0–53)
AST: 27 U/L (ref 0–37)
Albumin: 4.8 g/dL (ref 3.5–5.2)
Alkaline Phosphatase: 47 U/L (ref 39–117)
BUN: 19 mg/dL (ref 6–23)
CO2: 25 mEq/L (ref 19–32)
Calcium: 9.7 mg/dL (ref 8.4–10.5)
Chloride: 104 mEq/L (ref 96–112)
Creatinine, Ser: 1.17 mg/dL (ref 0.40–1.50)
GFR: 82.67 mL/min (ref >60.00)
Glucose, Bld: 94 mg/dL (ref 70–99)
Potassium: 4.2 mEq/L (ref 3.5–5.1)
Sodium: 138 mEq/L (ref 135–145)
Total Bilirubin: 0.8 mg/dL (ref 0.2–1.2)
Total Protein: 8.2 g/dL (ref 6.0–8.3)

## 2020-10-14 LAB — LIPID PANEL
Cholesterol: 174 mg/dL (ref 0–200)
HDL: 54.3 mg/dL (ref >39.00)
LDL Cholesterol: 103 mg/dL — ABNORMAL HIGH (ref 0–99)
NonHDL: 119.43
Total CHOL/HDL Ratio: 3
Triglycerides: 82 mg/dL (ref 0.0–149.0)
VLDL: 16.4 mg/dL (ref 0.0–40.0)

## 2020-10-14 LAB — VITAMIN D 25 HYDROXY (VIT D DEFICIENCY, FRACTURES): VITD: 36.14 ng/mL (ref 30.00–100.00)

## 2020-10-14 LAB — TSH: TSH: 1.01 u[IU]/mL (ref 0.35–4.50)

## 2020-10-14 NOTE — Progress Notes (Signed)
Patient: William Cowan MRN: [id number] DOB: 08/17/1986 PCP: Orland Mustard, MD     Subjective:  Chief Complaint  Patient presents with  . Annual Exam    HPI: The patient is a 34 y.o. male who presents today for annual exam. He denies any changes to past medical history. There have been no recent hospitalizations. They are not following a well balanced diet and exercise plan. Weight has been stable. No complaints today.   Hx of vitamin D deficiency. Is on daily replacement.   His mother has a type of blood cancer. His grandfather had prostate cancer. No colon or breast cancer.   Immunization History  Administered Date(s) Administered  . Tdap 10/11/2018   Colonoscopy: routine screening  PSA: routine screening   Review of Systems  Constitutional: Negative for chills, fatigue and fever.  HENT: Negative for dental problem, ear pain, hearing loss and trouble swallowing.   Eyes: Negative for visual disturbance.  Respiratory: Negative for cough, chest tightness and shortness of breath.   Cardiovascular: Negative for chest pain, palpitations and leg swelling.  Gastrointestinal: Negative for abdominal pain, blood in stool, diarrhea and nausea.  Endocrine: Negative for cold intolerance, polydipsia, polyphagia and polyuria.  Genitourinary: Negative for dysuria and hematuria.  Musculoskeletal: Positive for back pain. Negative for arthralgias and joint swelling.  Skin: Negative for rash.  Neurological: Negative for dizziness and headaches.  Psychiatric/Behavioral: Negative for dysphoric mood and sleep disturbance. The patient is not nervous/anxious.     Allergies Patient has No Known Allergies.  Past Medical History Patient  has a past medical history of Chronic bronchitis (HCC), History of chicken pox, and Screening for HIV (human immunodeficiency virus).  Surgical History Patient  has a past surgical history that includes Cyst excision (Left, 12/30/2019).  Family  History Pateint's family history includes Cancer in his maternal grandfather; Heart attack in his father; Heart failure in his father; Hyperlipidemia in his father; Hypertension in his father; Miscarriages / [place] in his mother.  Social History Patient  reports that he has quit smoking. His smoking use included cigarettes. He has never used smokeless tobacco. He reports current alcohol use. He reports previous drug use. Drug: Marijuana.    Objective: Vitals:   10/14/20 0905  BP: 112/82  Pulse: 93  Temp: 98.4 F (36.9 C)  TempSrc: Temporal  SpO2: 96%  Weight: 207 lb 3.2 oz (94 kg)  Height: 6\' 2"  (1.88 m)    Body mass index is 26.6 kg/m.  Physical Exam Vitals reviewed.  Constitutional:      Appearance: Normal appearance. He is well-developed, normal weight and well-nourished.  HENT:     Head: Normocephalic and atraumatic.     Right Ear: Tympanic membrane, ear canal and external ear normal.     Left Ear: Tympanic membrane, ear canal and external ear normal.     Mouth/Throat:     Mouth: Oropharynx is clear and moist. Mucous membranes are moist.  Eyes:     Extraocular Movements: EOM normal.     Conjunctiva/sclera: Conjunctivae normal.     Pupils: Pupils are equal, round, and reactive to light.  Neck:     Thyroid: No thyromegaly.  Cardiovascular:     Rate and Rhythm: Normal rate and regular rhythm.     Pulses: Normal pulses and intact distal pulses.     Heart sounds: Normal heart sounds. No murmur heard.   Pulmonary:     Effort: Pulmonary effort is normal.     Breath sounds: Normal breath sounds.  Abdominal:     General: Abdomen is flat. Bowel sounds are normal. There is no distension.     Palpations: Abdomen is soft.     Tenderness: There is no abdominal tenderness.  Musculoskeletal:     Cervical back: Normal range of motion and neck supple.  Lymphadenopathy:     Cervical: No cervical adenopathy.  Skin:    General: Skin is warm and dry.     Capillary Refill:  Capillary refill takes less than 2 seconds.     Findings: No rash.  Neurological:     General: No focal deficit present.     Mental Status: He is alert and oriented to person, place, and time.     Cranial Nerves: No cranial nerve deficit.     Coordination: Coordination normal.     Deep Tendon Reflexes: Reflexes normal.  Psychiatric:        Mood and Affect: Mood and affect and mood normal.        Behavior: Behavior normal.        Flowsheet Row Office Visit from 10/14/2020 in [facility]  PHQ-2 Total Score 0      Assessment/plan: 1. Annual physical exam routine fasting labs today. HM reviewed. Encouraged him to start an exercise routine and discussed recommendations of 5x/week for 30 minutes. Eats healthy. Had covid last year. Discussed vitamins. F/u in one year or as needed.  Patient counseling [x]    Nutrition: Stressed importance of moderation in sodium/caffeine intake, saturated fat and cholesterol, caloric balance, sufficient intake of fresh fruits, vegetables, fiber, calcium, iron, and 1 mg of folate supplement per day (for females capable of pregnancy).  [x]    Stressed the importance of regular exercise.   []    Substance Abuse: Discussed cessation/primary prevention of tobacco, alcohol, or other drug use; driving or other dangerous activities under the influence; availability of treatment for abuse.   [x]    Injury prevention: Discussed safety belts, safety helmets, smoke detector, smoking near bedding or upholstery.   [x]    Sexuality: Discussed sexually transmitted diseases, partner selection, use of condoms, avoidance of unintended pregnancy  and contraceptive alternatives.  [x]    Dental health: Discussed importance of regular tooth brushing, flossing, and dental visits.  [x]    Health maintenance and immunizations reviewed. Please refer to Health maintenance section.    - CBC with Differential/Platelet - Comprehensive metabolic panel - Lipid panel -  TSH  2. Vitamin D deficiency  - VITAMIN D 25 Hydroxy (Vit-D Deficiency, Fractures)  3. Encounter for hepatitis C screening test for low risk patient  - Hepatitis C antibody    This visit occurred during the SARS-CoV-2 public health emergency.  Safety protocols were in place, including screening questions prior to the visit, additional usage of staff PPE, and extensive cleaning of exam room while observing appropriate contact time as indicated for disinfecting solutions.     Return in about 1 year (around 10/14/2021) for annual .     , MD [facility]  10/14/2020

## 2020-10-14 NOTE — Patient Instructions (Signed)
Keep up the vitamin D.  Start exercising! Other than that you look great! Happy new year! Dr. Rogers Blocker   Preventive Care 32-32 Years Old, Male Preventive care refers to lifestyle choices and visits with your health care provider that can promote health and wellness. This includes:  A yearly physical exam. This is also called an annual well check.  Regular dental and eye exams.  Immunizations.  Screening for certain conditions.  Healthy lifestyle choices, such as eating a healthy diet, getting regular exercise, not using drugs or products that contain nicotine and tobacco, and limiting alcohol use. What can I expect for my preventive care visit? Physical exam Your health care provider will check:  Height and weight. These may be used to calculate body mass index (BMI), which is a measurement that tells if you are at a healthy weight.  Heart rate and blood pressure.  Your skin for abnormal spots. Counseling Your health care provider may ask you questions about:  Alcohol, tobacco, and drug use.  Emotional well-being.  Home and relationship well-being.  Sexual activity.  Eating habits.  Work and work Statistician. What immunizations do I need?  Influenza (flu) vaccine  This is recommended every year. Tetanus, diphtheria, and pertussis (Tdap) vaccine  You may need a Td booster every 10 years. Varicella (chickenpox) vaccine  You may need this vaccine if you have not already been vaccinated. Human papillomavirus (HPV) vaccine  If recommended by your health care provider, you may need three doses over 6 months. Measles, mumps, and rubella (MMR) vaccine  You may need at least one dose of MMR. You may also need a second dose. Meningococcal conjugate (MenACWY) vaccine  One dose is recommended if you are 94-30 years old and a Market researcher living in a residence hall, or if you have one of several medical conditions. You may also need additional booster  doses. Pneumococcal conjugate (PCV13) vaccine  You may need this if you have certain conditions and were not previously vaccinated. Pneumococcal polysaccharide (PPSV23) vaccine  You may need one or two doses if you smoke cigarettes or if you have certain conditions. Hepatitis A vaccine  You may need this if you have certain conditions or if you travel or work in places where you may be exposed to hepatitis A. Hepatitis B vaccine  You may need this if you have certain conditions or if you travel or work in places where you may be exposed to hepatitis B. Haemophilus influenzae type b (Hib) vaccine  You may need this if you have certain risk factors. You may receive vaccines as individual doses or as more than one vaccine together in one shot (combination vaccines). Talk with your health care provider about the risks and benefits of combination vaccines. What tests do I need? Blood tests  Lipid and cholesterol levels. These may be checked every 5 years starting at age 16.  Hepatitis C test.  Hepatitis B test. Screening   Diabetes screening. This is done by checking your blood sugar (glucose) after you have not eaten for a while (fasting).  Sexually transmitted disease (STD) testing. Talk with your health care provider about your test results, treatment options, and if necessary, the need for more tests. Follow these instructions at home: Eating and drinking   Eat a diet that includes fresh fruits and vegetables, whole grains, lean protein, and low-fat dairy products.  Take vitamin and mineral supplements as recommended by your health care provider.  Do not drink alcohol if your  health care provider tells you not to drink.  If you drink alcohol: ? Limit how much you have to 0-2 drinks a day. ? Be aware of how much alcohol is in your drink. In the [place] one drink equals one 12 oz bottle of beer (355 mL), one 5 oz glass of wine (148 mL), or one 1 oz glass of hard liquor (44  mL). Lifestyle  Take daily care of your teeth and gums.  Stay active. Exercise for at least 30 minutes on 5 or more days each week.  Do not use any products that contain nicotine or tobacco, such as cigarettes, e-cigarettes, and chewing tobacco. If you need help quitting, ask your health care provider.  If you are sexually active, practice safe sex. Use a condom or other form of protection to prevent STIs (sexually transmitted infections). What's next?  Go to your health care provider once a year for a well check visit.  Ask your health care provider how often you should have your eyes and teeth checked.  Stay up to date on all vaccines. This information is not intended to replace advice given to you by your health care provider. Make sure you discuss any questions you have with your health care provider. Document Revised: 09/27/2018 Document Reviewed: 09/27/2018 Elsevier Patient Education  2020 [employer].

## 2020-10-15 LAB — HEPATITIS C ANTIBODY
Hepatitis C Ab: NONREACTIVE
SIGNAL TO CUT-OFF: 0.03 (ref <1.00)

## 2020-11-04 DIAGNOSIS — Z20822 Contact with and (suspected) exposure to covid-19: Secondary | ICD-10-CM | POA: Diagnosis not present

## 2021-01-20 DIAGNOSIS — H40013 Open angle with borderline findings, low risk, bilateral: Secondary | ICD-10-CM | POA: Diagnosis not present

## 2021-07-19 DIAGNOSIS — H40013 Open angle with borderline findings, low risk, bilateral: Secondary | ICD-10-CM | POA: Diagnosis not present

## 2021-10-14 NOTE — Progress Notes (Incomplete)
William Cowan is a 35 y.o. male here for follow up referred by former PCP, Dr. Artis Flock.  SCRIBE STATEMENT  History of Present Illness:   No chief complaint on file.   HPI  Hx of Vitamin D Deficiency  At this time, William Cowan is *** taking a vitamin D supplement. Denies ***.  Past Medical History:  Diagnosis Date   Chronic bronchitis (HCC)    History of chicken pox    Screening for HIV (human immunodeficiency virus)      Social History   Tobacco Use   Smoking status: Former    Types: Cigarettes   Smokeless tobacco: Never  Vaping Use   Vaping Use: Former  Substance Use Topics   Alcohol use: Yes    Comment: occas   Drug use: Not Currently    Types: Marijuana    Past Surgical History:  Procedure Laterality Date   CYST EXCISION Left 12/30/2019   Procedure: EXCISION OF LEFT GROIN CHRONIC SEBACEOUS CYST;  Surgeon: [name], MD;  Location: [facility];  Service: General;  Laterality: Left;    Family History  Problem Relation Age of Onset   Miscarriages / Stillbirths Mother    Heart failure Father    Hypertension Father    Heart attack Father    Hyperlipidemia Father    Cancer Maternal Grandfather     No Known Allergies  Current Medications:   Current Outpatient Medications:    Multiple Vitamin (MULTIVITAMIN WITH MINERALS) TABS tablet, Take 1 tablet by mouth daily., Disp: , Rfl:    Review of Systems:   ROS Negative unless otherwise specified per HPI. Vitals:   There were no vitals filed for this visit.   There is no height or weight on file to calculate BMI.  Physical Exam:   Physical Exam  Assessment and Plan:       I,Havlyn C Ratchford,acting as a scribe for Jarold Motto, PA.,have documented all relevant documentation on the behalf of Jarold Motto, PA,as directed by  Jarold Motto, PA while in the presence of Jarold Motto, [place].  ***  Jarold Motto, PA-C

## 2021-10-15 ENCOUNTER — Encounter: Payer: Self-pay | Admitting: Physician Assistant

## 2021-10-15 ENCOUNTER — Other Ambulatory Visit: Payer: Self-pay

## 2021-10-15 ENCOUNTER — Encounter: Payer: BC Managed Care – PPO | Admitting: Family Medicine

## 2021-10-15 ENCOUNTER — Ambulatory Visit (INDEPENDENT_AMBULATORY_CARE_PROVIDER_SITE_OTHER): Payer: BC Managed Care – PPO | Admitting: Physician Assistant

## 2021-10-15 VITALS — BP 130/78 | HR 73 | Temp 98.6°F | Ht 74.0 in | Wt 225.4 lb

## 2021-10-15 DIAGNOSIS — Z113 Encounter for screening for infections with a predominantly sexual mode of transmission: Secondary | ICD-10-CM

## 2021-10-15 DIAGNOSIS — Z0000 Encounter for general adult medical examination without abnormal findings: Secondary | ICD-10-CM | POA: Diagnosis not present

## 2021-10-15 DIAGNOSIS — E669 Obesity, unspecified: Secondary | ICD-10-CM

## 2021-10-15 NOTE — Progress Notes (Signed)
Subjective:    William Cowan is a 35 y.o. male and is here for a comprehensive physical exam.  HPI  There are no preventive care reminders to display for this patient.  Acute Concerns: STD Screening At this time William Cowan has no concerns but would still like this checked just in case. Denies any ongoing sx   Chronic Issues: None  Health Maintenance: Immunizations -- Covid- Due Influenza-  Declined Tdap-  FXJ;8832 Colonoscopy -- N/A PSA -- N/A Diet -- Eats all food groups Dentistry- UTD Ophthalmology- UTD Sleep habits -- Normal schedule  Exercise -- Not currently outside of work Weight -- Gained 18 lbs since last visit, no concerns Weight history Wt Readings from Last 10 Encounters:  10/15/21 225 lb 6.4 oz (102.2 kg)  10/14/20 207 lb 3.2 oz (94 kg)  08/20/20 214 lb 6.1 oz (97.2 kg)  12/30/19 186 lb 1.1 oz (84.4 kg)  10/14/19 183 lb 3.2 oz (83.1 kg)  10/11/18 183 lb 6.4 oz (83.2 kg)  01/03/15 190 lb (86.2 kg)   Body mass index is 28.94 kg/m. Mood -- Stable Tobacco use --  Tobacco Use: Medium Risk   Smoking Tobacco Use: Former   Smokeless Tobacco Use: Never   Passive Exposure: Not on file    Alcohol use ---  reports current alcohol use.   Depression screen PHQ 2/9 10/15/2021  Decreased Interest 0  Down, Depressed, Hopeless 0  PHQ - 2 Score 0     Other providers/specialists: Patient Care Team: Inda Coke, [place] as PCP - General (Physician Assistant)   PMHx, SurgHx, SocialHx, Medications, and Allergies were reviewed in the Visit Navigator and updated as appropriate.   Past Medical History:  Diagnosis Date   Chronic bronchitis ([place])    History of chicken pox    Screening for HIV (human immunodeficiency virus)      Past Surgical History:  Procedure Laterality Date   CYST EXCISION Left 12/30/2019   Procedure: EXCISION OF LEFT GROIN CHRONIC SEBACEOUS CYST;  Surgeon: Coralie Keens, MD;  Location: [place];  Service: General;   Laterality: Left;     Family History  Problem Relation Age of Onset   Multiple myeloma Mother    COPD Mother    Heart failure Father    Hypertension Father    Heart attack Father    Hyperlipidemia Father    Bone cancer Father    Lung cancer Maternal Grandfather    Colon cancer Neg Hx     Social History   Tobacco Use   Smoking status: Former    Types: Cigarettes   Smokeless tobacco: Never  Vaping Use   Vaping Use: Former  Substance Use Topics   Alcohol use: Yes    Comment: occas   Drug use: Not Currently    Types: Marijuana    Review of Systems:   Review of Systems  Constitutional:  Negative for chills, fever, malaise/fatigue and weight loss.  HENT:  Negative for hearing loss, sinus pain and sore throat.   Respiratory:  Negative for cough and hemoptysis.   Cardiovascular:  Negative for chest pain, palpitations, leg swelling and PND.  Gastrointestinal:  Negative for abdominal pain, constipation, diarrhea, heartburn, nausea and vomiting.  Genitourinary:  Negative for dysuria, frequency and urgency.  Musculoskeletal:  Negative for back pain, myalgias and neck pain.  Skin:  Negative for itching and rash.  Neurological:  Negative for dizziness, tingling, seizures and headaches.  Endo/Heme/Allergies:  Negative for polydipsia.  Psychiatric/Behavioral:  Negative  for depression. The patient is not nervous/anxious.    Objective:   Vitals:   10/15/21 0933  BP: 130/78  Pulse: 73  Temp: 98.6 F (37 C)  SpO2: 98%   Body mass index is 28.94 kg/m.  General Appearance:  Alert, cooperative, no distress, appears stated age  Head:  Normocephalic, without obvious abnormality, atraumatic  Eyes:  PERRL, conjunctiva/corneas clear, EOM's intact, fundi benign, both eyes       Ears:  Normal TM's and external ear canals, both ears  Nose: Nares normal, septum midline, mucosa normal, no drainage    or sinus tenderness  Throat: Lips, mucosa, and tongue normal; teeth and gums normal   Neck: Supple, symmetrical, trachea midline, no adenopathy; thyroid:  No enlargement/tenderness/nodules; no carotit bruit or JVD  Back:   Symmetric, no curvature, ROM normal, no CVA tenderness  Lungs:   Clear to auscultation bilaterally, respirations unlabored  Chest wall:  No tenderness or deformity  Heart:  Regular rate and rhythm, S1 and S2 normal, no murmur, rub   or gallop  Abdomen:   Soft, non-tender, bowel sounds active all four quadrants, no masses, no organomegaly  Extremities: Extremities normal, atraumatic, no cyanosis or edema  Prostate: Not done.   Skin: Skin color, texture, turgor normal, no rashes or lesions  Lymph nodes: Cervical, supraclavicular, and axillary nodes normal  Neurologic: CNII-XII grossly intact. Normal strength, sensation and reflexes throughout    Assessment/Plan:   Routine Physical Examination Today patient counseled on age appropriate routine health concerns for screening and prevention, each reviewed and up to date or declined. Immunizations reviewed and up to date or declined. Labs ordered and reviewed. Risk factors for depression reviewed and negative. Hearing function and visual acuity are intact. ADLs screened and addressed as needed. Functional ability and level of safety reviewed and appropriate. Education, counseling and referrals performed based on assessed risks today. Patient provided with a copy of personalized plan for preventive services.  Obesity, unspecified classification, unspecified obesity type, unspecified whether serious comorbidity present Encouraged participation in well balanced diet and regular exercise   Screening examination for STD (sexually transmitted disease) No red flags on discussion Will update labs per patients request    Patient Counseling: '[x]'   Nutrition: Stressed importance of moderation in sodium/caffeine intake, saturated fat and cholesterol, caloric balance, sufficient intake of fresh fruits, vegetables, and  fiber.  '[x]'   Stressed the importance of regular exercise.   '[]'   Substance Abuse: Discussed cessation/primary prevention of tobacco, alcohol, or other drug use; driving or other dangerous activities under the influence; availability of treatment for abuse.   '[x]'   Injury prevention: Discussed safety belts, safety helmets, smoke detector, smoking near bedding or upholstery.   '[]'   Sexuality: Discussed sexually transmitted diseases, partner selection, use of condoms, avoidance of unintended pregnancy  and contraceptive alternatives.   '[x]'   Dental health: Discussed importance of regular tooth brushing, flossing, and dental visits.  '[x]'   Health maintenance and immunizations reviewed. Please refer to Health maintenance section.    I,Havlyn C Ratchford,acting as a Education administrator for [employer], PA.,have documented all relevant documentation on the behalf of Inda Coke, PA,as directed by  Inda Coke, PA while in the presence of Inda Coke, [place].  I, Inda Coke, [place], have reviewed all documentation for this visit. The documentation on 10/15/21 for the exam, diagnosis, procedures, and orders are all accurate and complete.   Inda Coke, PA-C [place]

## 2021-10-15 NOTE — Patient Instructions (Signed)
It was great to see you!  Please make an appointment with the lab on your way out. I would like for you to return for lab work within 1-2 weeks. After midnight on the day of the lab draw, please do not eat anything. You may have water, black coffee, unsweetened tea.  Take care,  Sherri Levenhagen   

## 2021-10-19 ENCOUNTER — Other Ambulatory Visit (HOSPITAL_COMMUNITY)
Admission: RE | Admit: 2021-10-19 | Discharge: 2021-10-19 | Disposition: A | Discharge status: Home / Self Care | Payer: BC Managed Care – PPO | Source: Ambulatory Visit | Attending: Physician Assistant | Admitting: Physician Assistant

## 2021-10-19 ENCOUNTER — Other Ambulatory Visit: Payer: Self-pay

## 2021-10-19 ENCOUNTER — Other Ambulatory Visit (INDEPENDENT_AMBULATORY_CARE_PROVIDER_SITE_OTHER): Payer: BC Managed Care – PPO

## 2021-10-19 DIAGNOSIS — E669 Obesity, unspecified: Secondary | ICD-10-CM | POA: Diagnosis not present

## 2021-10-19 DIAGNOSIS — Z113 Encounter for screening for infections with a predominantly sexual mode of transmission: Secondary | ICD-10-CM | POA: Diagnosis not present

## 2021-10-20 LAB — URINE CYTOLOGY ANCILLARY ONLY
Chlamydia: NEGATIVE
Comment: NEGATIVE
Comment: NEGATIVE
Comment: NORMAL
Neisseria Gonorrhea: NEGATIVE
Trichomonas: NEGATIVE

## 2021-10-20 LAB — LIPID PANEL
Cholesterol: 186 mg/dL (ref 0–200)
HDL: 39.6 mg/dL (ref >39.00)
NonHDL: 146.24
Total CHOL/HDL Ratio: 5
Triglycerides: 241 mg/dL — ABNORMAL HIGH (ref 0.0–149.0)
VLDL: 48.2 mg/dL — ABNORMAL HIGH (ref 0.0–40.0)

## 2021-10-20 LAB — COMPREHENSIVE METABOLIC PANEL
ALT: 38 U/L (ref 0–53)
AST: 35 U/L (ref 0–37)
Albumin: 4.6 g/dL (ref 3.5–5.2)
Alkaline Phosphatase: 53 U/L (ref 39–117)
BUN: 17 mg/dL (ref 6–23)
CO2: 27 mEq/L (ref 19–32)
Calcium: 9.7 mg/dL (ref 8.4–10.5)
Chloride: 102 mEq/L (ref 96–112)
Creatinine, Ser: 1.18 mg/dL (ref 0.40–1.50)
GFR: 81.24 mL/min (ref >60.00)
Glucose, Bld: 85 mg/dL (ref 70–99)
Potassium: 3.7 mEq/L (ref 3.5–5.1)
Sodium: 138 mEq/L (ref 135–145)
Total Bilirubin: 0.6 mg/dL (ref 0.2–1.2)
Total Protein: 8.1 g/dL (ref 6.0–8.3)

## 2021-10-20 LAB — CBC WITH DIFFERENTIAL/PLATELET
Basophils Absolute: 0 10*3/uL (ref 0.0–0.1)
Basophils Relative: 0.7 % (ref 0.0–3.0)
Eosinophils Absolute: 0.1 10*3/uL (ref 0.0–0.7)
Eosinophils Relative: 2.7 % (ref 0.0–5.0)
HCT: 42.9 % (ref 39.0–52.0)
Hemoglobin: 14.6 g/dL (ref 13.0–17.0)
Lymphocytes Relative: 49.9 % — ABNORMAL HIGH (ref 12.0–46.0)
Lymphs Abs: 2 10*3/uL (ref 0.7–4.0)
MCHC: 34 g/dL (ref 30.0–36.0)
MCV: 89.2 fl (ref 78.0–100.0)
Monocytes Absolute: 0.6 10*3/uL (ref 0.1–1.0)
Monocytes Relative: 14 % — ABNORMAL HIGH (ref 3.0–12.0)
Neutro Abs: 1.3 10*3/uL — ABNORMAL LOW (ref 1.4–7.7)
Neutrophils Relative %: 32.7 % — ABNORMAL LOW (ref 43.0–77.0)
Platelets: 284 10*3/uL (ref 150.0–400.0)
RBC: 4.81 Mil/uL (ref 4.22–5.81)
RDW: 13.6 % (ref 11.5–15.5)
WBC: 4 10*3/uL (ref 4.0–10.5)

## 2021-10-20 LAB — HIV ANTIBODY (ROUTINE TESTING W REFLEX): HIV 1&2 Ab, 4th Generation: NONREACTIVE

## 2021-10-20 LAB — RPR: RPR Ser Ql: NONREACTIVE

## 2021-10-20 LAB — LDL CHOLESTEROL, DIRECT: Direct LDL: 130 mg/dL

## 2022-01-19 DIAGNOSIS — H40013 Open angle with borderline findings, low risk, bilateral: Secondary | ICD-10-CM | POA: Diagnosis not present

## 2022-03-25 IMAGING — CT CT HEAD W/O CM
3 series · 15 of 47 positions shown, 18 images · non-contrast
Comparison: None.

CLINICAL DATA: Left-sided headache 1 week. Rule out intracranial
hemorrhage

EXAM:
CT HEAD WITHOUT CONTRAST
TECHNIQUE: Contiguous axial images were obtained from the base of the skull
through the vertex without intravenous contrast.

[Series 4: coronal soft tissue · coronal · 0.31mm/px · 3 of 74 slices shown]
[im 25/74  brain]
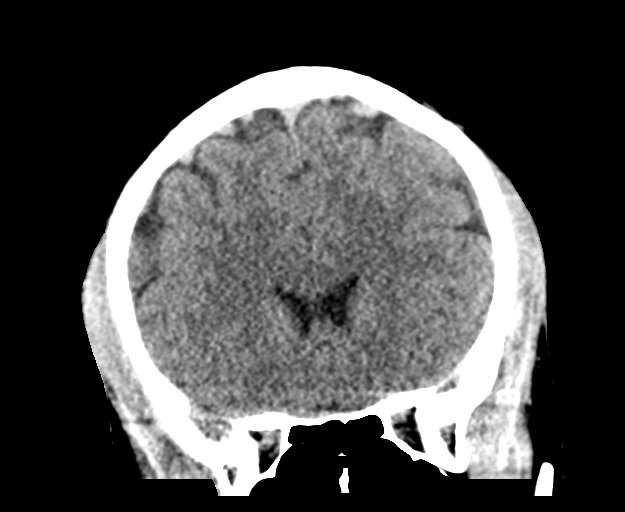
[im 33/74  brain]
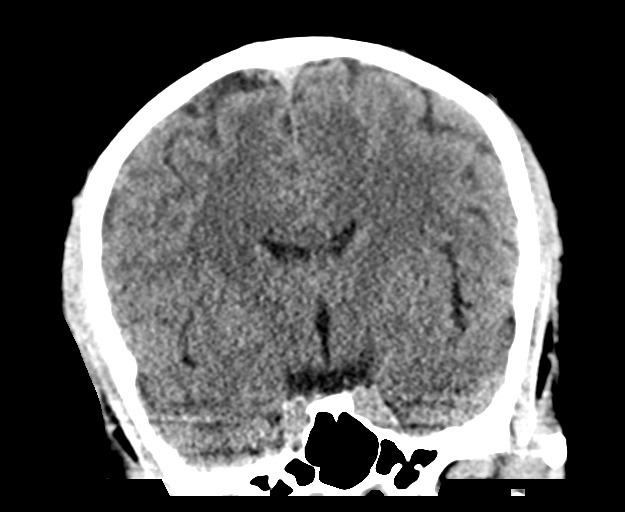
[im 41/74  brain]
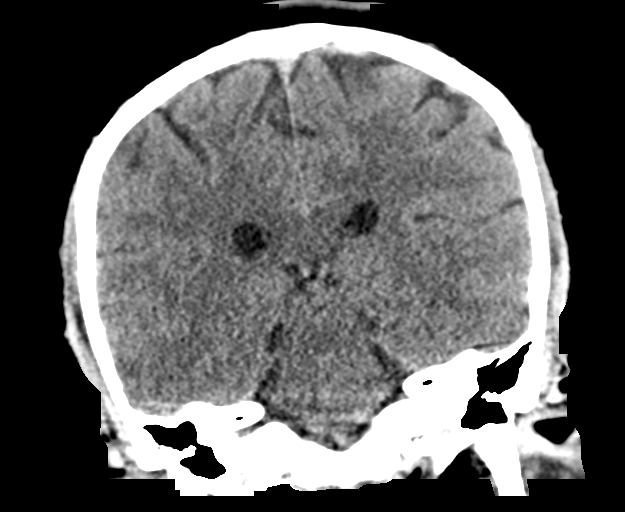

[Series 5: sagittal soft tissue · sagittal · 0.30mm/px · 3 of 67 slices shown]
[im 23/67  brain]
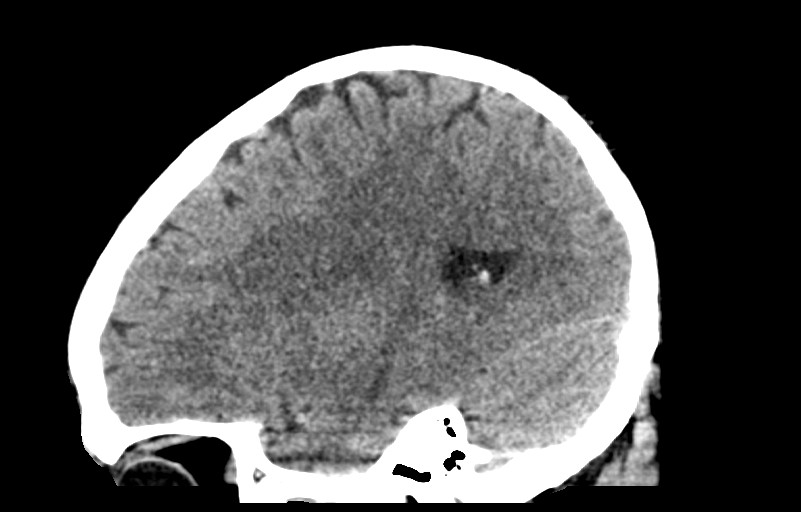
[im 34/67  brain]
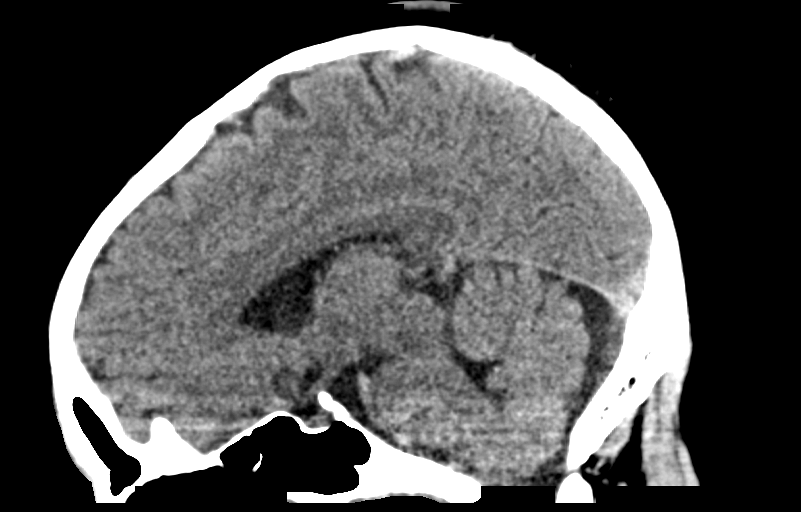
[im 45/67  brain]
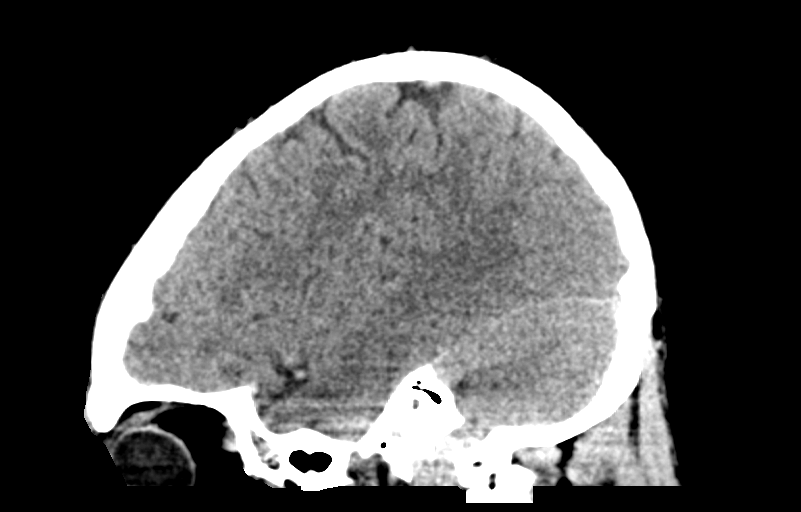

[Series 6: head wo · axial · 0.45mm/px · z∈[-150,-25]mm · 9 of 31 slices shown, 12 images]
[im 3/31  brain]
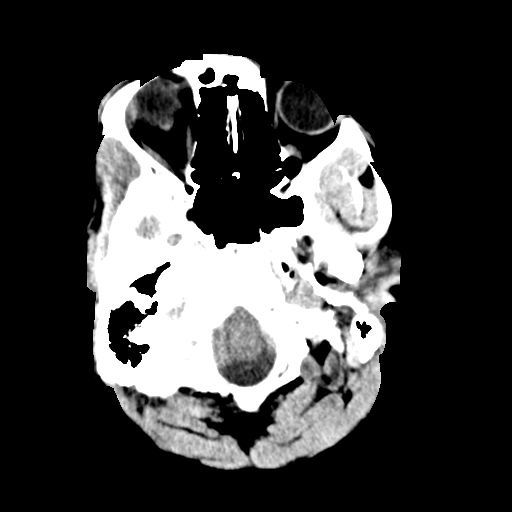
[im 3/31  bone]
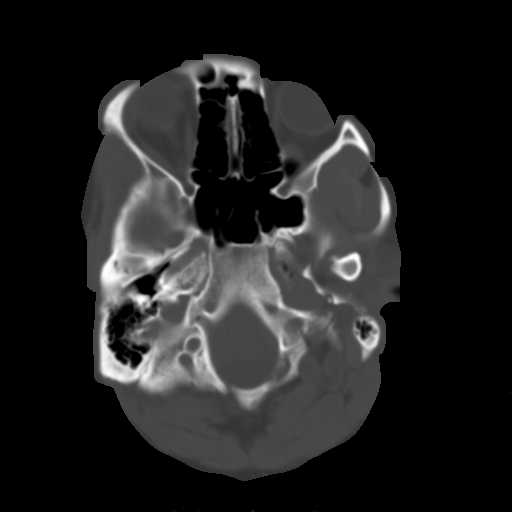
[im 6/31  brain]
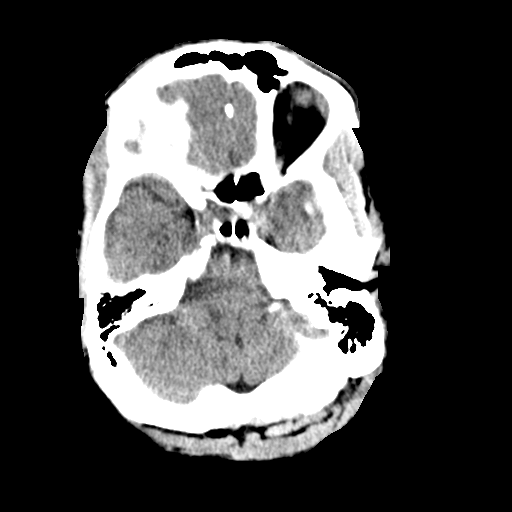
[im 9/31  brain]
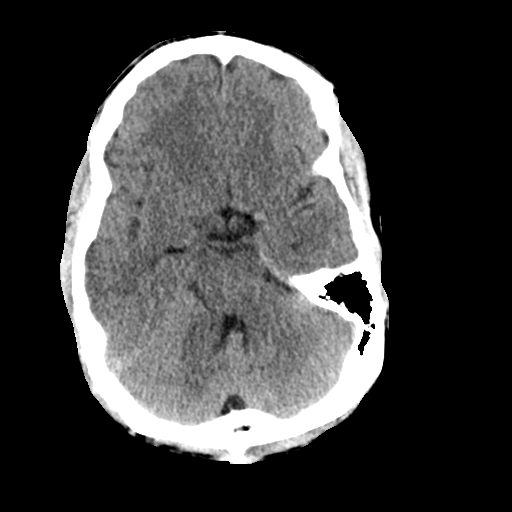
[im 12/31  brain]
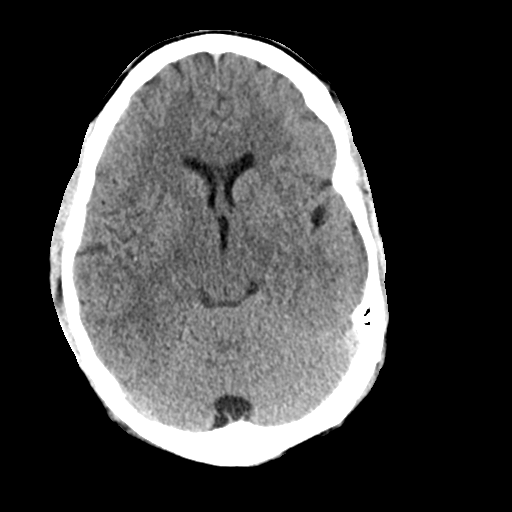
[im 16/31  brain]
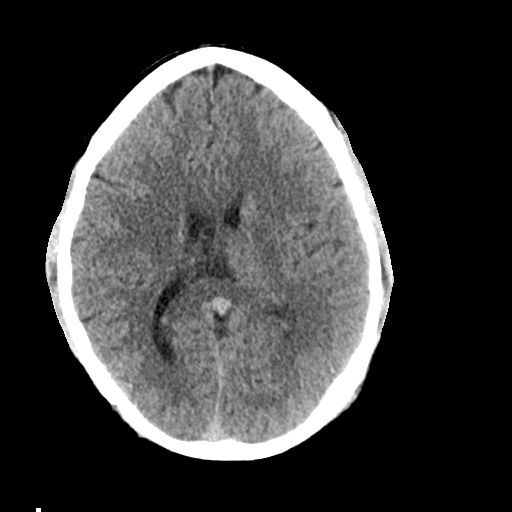
[im 16/31  bone]
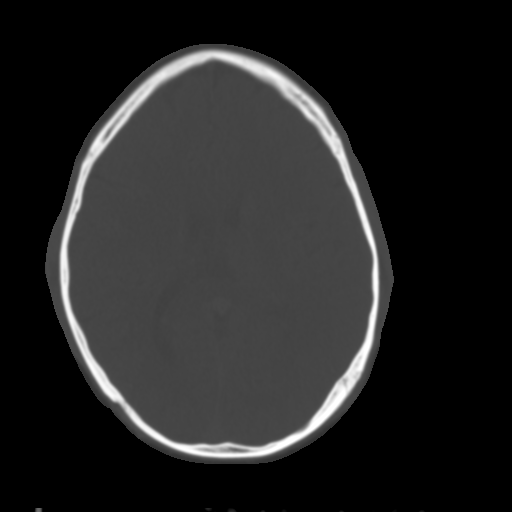
[im 19/31  brain]
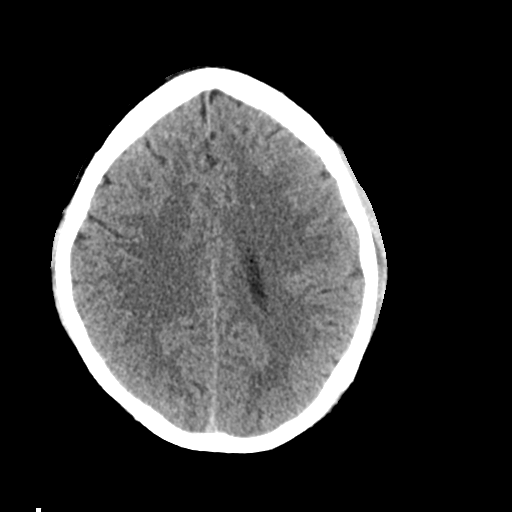
[im 22/31  brain]
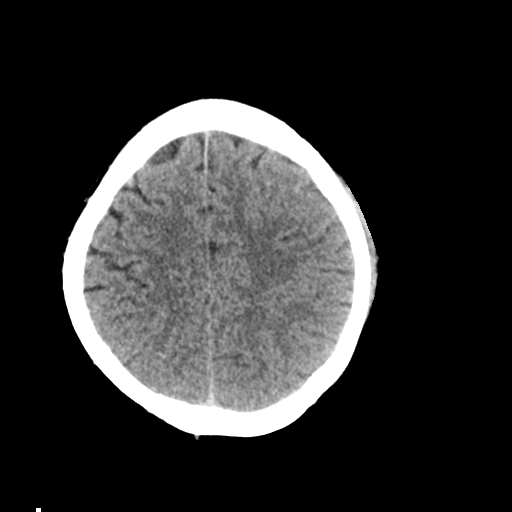
[im 25/31  brain]
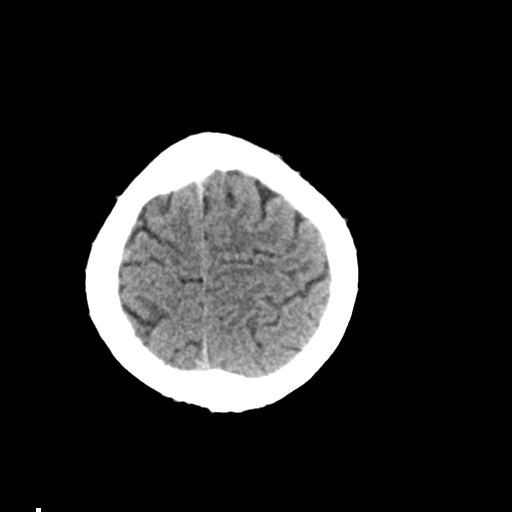
[im 28/31  brain]
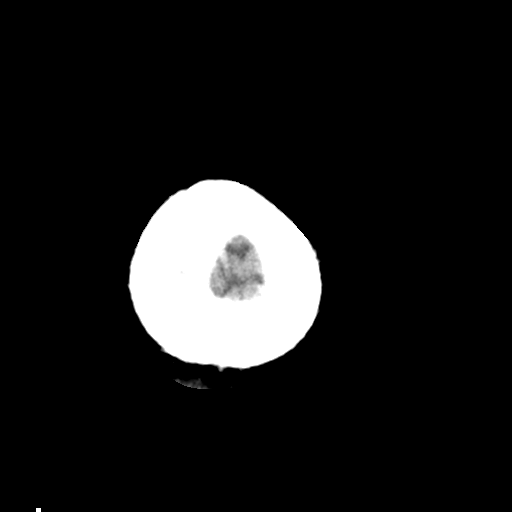
[im 28/31  bone]
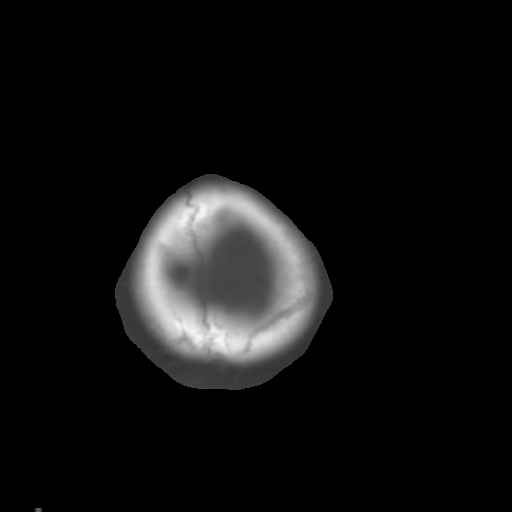

[15 of 47 positions shown; findings below may reference images not displayed]

FINDINGS: Brain: No evidence of acute infarction, hemorrhage, hydrocephalus,
extra-axial collection or mass lesion/mass effect.

Vascular: Negative for hyperdense vessel

Skull: Negative

Sinuses/Orbits: Negative

Other: None
IMPRESSION: Normal CT head.

## 2022-07-26 DIAGNOSIS — H40013 Open angle with borderline findings, low risk, bilateral: Secondary | ICD-10-CM | POA: Diagnosis not present

## 2022-10-19 ENCOUNTER — Encounter: Payer: Self-pay | Admitting: Physician Assistant

## 2022-10-19 ENCOUNTER — Ambulatory Visit (INDEPENDENT_AMBULATORY_CARE_PROVIDER_SITE_OTHER): Payer: BC Managed Care – PPO | Admitting: Physician Assistant

## 2022-10-19 VITALS — BP 110/70 | HR 79 | Temp 98.0°F | Ht 74.0 in | Wt 205.0 lb

## 2022-10-19 DIAGNOSIS — R21 Rash and other nonspecific skin eruption: Secondary | ICD-10-CM

## 2022-10-19 DIAGNOSIS — E663 Overweight: Secondary | ICD-10-CM

## 2022-10-19 DIAGNOSIS — Z0000 Encounter for general adult medical examination without abnormal findings: Secondary | ICD-10-CM | POA: Diagnosis not present

## 2022-10-19 DIAGNOSIS — E559 Vitamin D deficiency, unspecified: Secondary | ICD-10-CM

## 2022-10-19 MED ORDER — TRIAMCINOLONE ACETONIDE 0.5 % EX OINT: TOPICAL_OINTMENT | CUTANEOUS | 0 refills | Status: DC

## 2022-10-19 NOTE — Progress Notes (Signed)
Subjective:    William Cowan is a 37 y.o. male and is here for a comprehensive physical exam.  HPI  There are no preventive care reminders to display for this patient.   Acute Concerns: None  Chronic Issues: Hypertriglyceridemia Not on any medications. Managing with lifestyle. Lab Results  Component Value Date   CHOL 186 10/19/2021   HDL 39.60 10/19/2021   LDLCALC 103 (H) 10/14/2020   LDLDIRECT 130.0 10/19/2021   TRIG 241.0 (H) 10/19/2021   CHOLHDL 5 10/19/2021    Rash He reports a patch of discolored/dry skin on his left posterior calf. First occurred x2-3 months ago, had resolved but has returned. He uses cortisone and Aquaphor with some relief but does not resolve. Was using a topical antifungal but this irritated his skin. Denies any pain. However, the skin itches if it gets dry.    Health Maintenance: Immunizations -- UTD Colonoscopy -- N/A PSA -- No results found for: "PSA1", "PSA" Diet -- balanced diet Sleep habits -- Sleeping well Exercise -- Active lifestyle- walking/standing for 12-13 hour shifts 5 days a week  Weight --  Recent weight history Wt Readings from Last 10 Encounters:  10/19/22 205 lb (93 kg)  10/15/21 225 lb 6.4 oz (102.2 kg)  10/14/20 207 lb 3.2 oz (94 kg)  08/20/20 214 lb 6.1 oz (97.2 kg)  12/30/19 186 lb 1.1 oz (84.4 kg)  10/14/19 183 lb 3.2 oz (83.1 kg)  10/11/18 183 lb 6.4 oz (83.2 kg)  01/03/15 190 lb (86.2 kg)   Body mass index is 26.32 kg/m.  Mood -- Doing well- did have some life stressors/family deaths this past year Alcohol use --  reports current alcohol use.  Tobacco use --  Tobacco Use: Medium Risk (10/19/2022)   Patient History    Smoking Tobacco Use: Former    Smokeless Tobacco Use: Never    Passive Exposure: Not on file    Eligible for Low Dose CT? no  UTD with eye doctor? Yes UTD with dentist? Yes     10/19/2022    2:52 PM  Depression screen PHQ 2/9  Decreased Interest 0  Down, Depressed, Hopeless 0   PHQ - 2 Score 0    Other providers/specialists: Patient Care Team: Inda Coke, [place] as PCP - General (Physician Assistant)    PMHx, SurgHx, SocialHx, Medications, and Allergies were reviewed in the Visit Navigator and updated as appropriate.   Past Medical History:  Diagnosis Date   Chronic bronchitis ([place])    History of chicken pox      Past Surgical History:  Procedure Laterality Date   CYST EXCISION Left 12/30/2019   Procedure: EXCISION OF LEFT GROIN CHRONIC SEBACEOUS CYST;  Surgeon: Coralie Keens, MD;  Location: [place];  Service: General;  Laterality: Left;     Family History  Problem Relation Age of Onset   Multiple myeloma Mother    COPD Mother    Heart failure Father    Hypertension Father    Heart attack Father    Hyperlipidemia Father    Bone cancer Father    Lung cancer Maternal Grandfather    Colon cancer Neg Hx     Social History   Tobacco Use   Smoking status: Former    Types: Cigarettes   Smokeless tobacco: Never  Vaping Use   Vaping Use: Former  Substance Use Topics   Alcohol use: Yes    Comment: occas   Drug use: Not Currently    Types: Marijuana  Review of Systems:   Review of Systems  Constitutional:  Negative for chills, fever, malaise/fatigue and weight loss.  HENT:  Negative for hearing loss, sinus pain and sore throat.   Respiratory:  Negative for cough and hemoptysis.   Cardiovascular:  Negative for chest pain, palpitations, leg swelling and PND.  Gastrointestinal:  Negative for abdominal pain, constipation, diarrhea, heartburn, nausea and vomiting.  Genitourinary:  Negative for dysuria, frequency and urgency.  Musculoskeletal:  Negative for back pain, myalgias and neck pain.  Skin:  Positive for rash. Negative for itching.  Neurological:  Negative for dizziness, tingling, seizures and headaches.  Endo/Heme/Allergies:  Negative for polydipsia.  Psychiatric/Behavioral:  Negative for depression. The  patient is not nervous/anxious.     Objective:    Vitals:   10/19/22 1452  BP: 110/70  Pulse: 79  Temp: 98 F (36.7 C)  SpO2: 97%    Body mass index is 26.32 kg/m.  General  Alert, cooperative, no distress, appears stated age  Head:  Normocephalic, without obvious abnormality, atraumatic  Eyes:  PERRL, conjunctiva/corneas clear, EOM's intact, fundi benign, both eyes       Ears:  Normal TM's and external ear canals, both ears  Nose: Nares normal, septum midline, mucosa normal, no drainage or sinus tenderness  Throat: Lips, mucosa, and tongue normal; teeth and gums normal  Neck: Supple, symmetrical, trachea midline, no adenopathy;     thyroid:  No enlargement/tenderness/nodules; no carotid bruit or JVD  Back:   Symmetric, no curvature, ROM normal, no CVA tenderness  Lungs:   Clear to auscultation bilaterally, respirations unlabored  Chest wall:  No tenderness or deformity  Heart:  Regular rate and rhythm, S1 and S2 normal, no murmur, rub or gallop  Abdomen:   Soft, non-tender, bowel sounds active all four quadrants, no masses, no organomegaly  Extremities: Extremities normal, atraumatic, no cyanosis or edema  Prostate : Deferred   Skin: Skin color, texture, turgor normal Hyperpigmented plaque approx 1.5 cm in diameter to posterior right calf  Lymph nodes: Cervical, supraclavicular, and axillary nodes normal  Neurologic: CNII-XII grossly intact. Normal strength, sensation and reflexes throughout   AssessmentPlan:   Routine physical examination Today patient counseled on age appropriate routine health concerns for screening and prevention, each reviewed and up to date or declined. Immunizations reviewed and up to date or declined. Labs ordered and reviewed. Risk factors for depression reviewed and negative. Hearing function and visual acuity are intact. ADLs screened and addressed as needed. Functional ability and level of safety reviewed and appropriate. Education, counseling  and referrals performed based on assessed risks today. Patient provided with a copy of personalized plan for preventive services.  Vitamin D deficiency Update Vit D and make recommendations accordingly   Overweight Continue efforts at healthy lifestyle  Rash and nonspecific skin eruption Possible eczema Trial triamcinolone If lack of improvement or any concerns, will have him follow-up so we can biopsy   I,Alexis Herring,acting as a scribe for Inda Coke, PA.,have documented all relevant documentation on the behalf of Inda Coke, PA,as directed by  Inda Coke, PA while in the presence of Inda Coke, [place].  I, Inda Coke, [place], have reviewed all documentation for this visit. The documentation on 10/19/22 for the exam, diagnosis, procedures, and orders are all accurate and complete.   Inda Coke, PA-C [place]

## 2022-10-19 NOTE — Patient Instructions (Addendum)
It was great to see you!  Trial the ointment for the spot on your leg -- if this does not help, please come back and see me!  Please go to the lab for blood work.   Our office will call you with your results unless you have chosen to receive results via MyChart.  If your blood work is normal we will follow-up each year for physicals and as scheduled for chronic medical problems.  If anything is abnormal we will treat accordingly and get you in for a follow-up.  Take care,  Aldona Bar

## 2022-10-20 ENCOUNTER — Other Ambulatory Visit: Payer: Self-pay | Admitting: Physician Assistant

## 2022-10-20 LAB — CBC WITH DIFFERENTIAL/PLATELET
Basophils Absolute: 0 10*3/uL (ref 0.0–0.1)
Basophils Relative: 0.3 % (ref 0.0–3.0)
Eosinophils Absolute: 0.1 10*3/uL (ref 0.0–0.7)
Eosinophils Relative: 1.9 % (ref 0.0–5.0)
HCT: 45.4 % (ref 39.0–52.0)
Hemoglobin: 15.6 g/dL (ref 13.0–17.0)
Lymphocytes Relative: 38.7 % (ref 12.0–46.0)
Lymphs Abs: 1.9 10*3/uL (ref 0.7–4.0)
MCHC: 34.3 g/dL (ref 30.0–36.0)
MCV: 91.9 fl (ref 78.0–100.0)
Monocytes Absolute: 0.7 10*3/uL (ref 0.1–1.0)
Monocytes Relative: 13.6 % — ABNORMAL HIGH (ref 3.0–12.0)
Neutro Abs: 2.3 10*3/uL (ref 1.4–7.7)
Neutrophils Relative %: 45.5 % (ref 43.0–77.0)
Platelets: 362 10*3/uL (ref 150.0–400.0)
RBC: 4.94 Mil/uL (ref 4.22–5.81)
RDW: 13.3 % (ref 11.5–15.5)
WBC: 5 10*3/uL (ref 4.0–10.5)

## 2022-10-20 LAB — COMPREHENSIVE METABOLIC PANEL
ALT: 35 U/L (ref 0–53)
AST: 25 U/L (ref 0–37)
Albumin: 4.6 g/dL (ref 3.5–5.2)
Alkaline Phosphatase: 51 U/L (ref 39–117)
BUN: 14 mg/dL (ref 6–23)
CO2: 28 mEq/L (ref 19–32)
Calcium: 10 mg/dL (ref 8.4–10.5)
Chloride: 103 mEq/L (ref 96–112)
Creatinine, Ser: 1.14 mg/dL (ref 0.40–1.50)
GFR: 84.08 mL/min (ref >60.00)
Glucose, Bld: 96 mg/dL (ref 70–99)
Potassium: 4.4 mEq/L (ref 3.5–5.1)
Sodium: 139 mEq/L (ref 135–145)
Total Bilirubin: 0.5 mg/dL (ref 0.2–1.2)
Total Protein: 7.7 g/dL (ref 6.0–8.3)

## 2022-10-20 LAB — LIPID PANEL
Cholesterol: 184 mg/dL (ref 0–200)
HDL: 50.8 mg/dL (ref >39.00)
LDL Cholesterol: 102 mg/dL — ABNORMAL HIGH (ref 0–99)
NonHDL: 133.46
Total CHOL/HDL Ratio: 4
Triglycerides: 157 mg/dL — ABNORMAL HIGH (ref 0.0–149.0)
VLDL: 31.4 mg/dL (ref 0.0–40.0)

## 2022-10-20 LAB — VITAMIN D 25 HYDROXY (VIT D DEFICIENCY, FRACTURES): VITD: 18.06 ng/mL — ABNORMAL LOW (ref 30.00–100.00)

## 2022-10-20 MED ORDER — VITAMIN D (ERGOCALCIFEROL) 1.25 MG (50000 UNIT) PO CAPS: 50000.0000 [IU] | ORAL_CAPSULE | ORAL | 0 refills | Status: DC

## 2023-01-30 DIAGNOSIS — H40013 Open angle with borderline findings, low risk, bilateral: Secondary | ICD-10-CM | POA: Diagnosis not present

## 2023-02-01 ENCOUNTER — Encounter: Payer: Self-pay | Admitting: Family

## 2023-02-01 ENCOUNTER — Ambulatory Visit (INDEPENDENT_AMBULATORY_CARE_PROVIDER_SITE_OTHER): Payer: BC Managed Care – PPO | Admitting: Family

## 2023-02-01 VITALS — BP 110/76 | HR 73 | Temp 97.0°F | Ht 74.0 in | Wt 207.8 lb

## 2023-02-01 DIAGNOSIS — M26629 Arthralgia of temporomandibular joint, unspecified side: Secondary | ICD-10-CM | POA: Diagnosis not present

## 2023-02-01 DIAGNOSIS — M25562 Pain in left knee: Secondary | ICD-10-CM

## 2023-02-01 DIAGNOSIS — H6121 Impacted cerumen, right ear: Secondary | ICD-10-CM

## 2023-02-01 DIAGNOSIS — G8929 Other chronic pain: Secondary | ICD-10-CM

## 2023-02-01 MED ORDER — CYCLOBENZAPRINE HCL 5 MG PO TABS: 5.0000 mg | ORAL_TABLET | Freq: Three times a day (TID) | ORAL | 1 refills | Status: DC | PRN

## 2023-02-01 MED ORDER — NAPROXEN 500 MG PO TABS: 500.0000 mg | ORAL_TABLET | Freq: Two times a day (BID) | ORAL | 0 refills | Status: AC | PRN

## 2023-02-01 NOTE — Progress Notes (Signed)
Patient ID: William Cowan, male    DOB: 1986/10/11, 37 y.o.   MRN: [id number]  Chief Complaint  Patient presents with   Ear Pain    sx for 1w   Knee Pain    Pt c/o left knee swelling and pain since he was a child.     HPI:       Knee pain:  left knee pain since a teenager, played football, never had an injury, works all day standing or walking wearing steel-toe boots, pain builds through the day. Pt has tried Aleve, goody powders and tylenol without relief. pt reports many coworkers a little older have had knee problems due to the physical demands of the job.  Ear/jaw pain:  Pt c/o right ear pain and jaw swelling, present for about a week. Reports pain when opening jaw, denies pain with chewing. Has tried tylenol and goodys which did not help. Denies having pain in past, does not know if he grinds his teeth.   Assessment & Plan:  1. Impacted cerumen of right ear - Verbal consent received to perform right ear lavage via Hydrogen peroxide/water mix solution. Curette used to remove visible cerumen. Pt tolerated well, complete evacuation of all cerumen obtained. Mild erythema but no bleeding noted in ear canal after procedure.  2. Chronic pain of left knee- sending Flexeril & Naproxen, advised on use & SE, also sending referral to sports med for more accurate DX.  - Ambulatory referral to Sports Medicine - cyclobenzaprine (FLEXERIL) 5 MG tablet; Take 1-2 tablets (5-10 mg total) by mouth 3 (three) times daily as needed for muscle spasms (Take with 1 Naproxen for jaw or knee pain.).  Dispense: 30 tablet; Refill: 1 - naproxen (NAPROSYN) 500 MG tablet; Take 1 tablet (500 mg total) by mouth 2 (two) times daily as needed for moderate pain (Take with Flexeril for jaw or knee pain.).  Dispense: 30 tablet; Refill: 0  3. TMJ pain dysfunction syndrome - sending Flexeril & Naproxen, advised on use & SE, use as directed for 1 week, if pain not improved, advised to call back to the office.  -  cyclobenzaprine (FLEXERIL) 5 MG tablet; Take 1-2 tablets (5-10 mg total) by mouth 3 (three) times daily as needed for muscle spasms (Take with 1 Naproxen for jaw or knee pain.).  Dispense: 30 tablet; Refill: 1 - naproxen (NAPROSYN) 500 MG tablet; Take 1 tablet (500 mg total) by mouth 2 (two) times daily as needed for moderate pain (Take with Flexeril for jaw or knee pain.).  Dispense: 30 tablet; Refill: 0   Subjective:    Outpatient Medications Prior to Visit  Medication Sig Dispense Refill   cholecalciferol (VITAMIN D3) 25 MCG (1000 UNIT) tablet Take 1,000 Units by mouth daily.     Multiple Vitamin (MULTIVITAMIN WITH MINERALS) TABS tablet Take 1 tablet by mouth daily.     triamcinolone ointment (KENALOG) 0.5 % Apply to affected area 1-2 times daily 15 g 0   Vitamin D, Ergocalciferol, (DRISDOL) 1.25 MG (50000 UNIT) CAPS capsule Take 1 capsule (50,000 Units total) by mouth every 7 (seven) days. 12 capsule 0   No facility-administered medications prior to visit.   Past Medical History:  Diagnosis Date   Chronic bronchitis    History of chicken pox    Past Surgical History:  Procedure Laterality Date   CYST EXCISION Left 12/30/2019   Procedure: EXCISION OF LEFT GROIN CHRONIC SEBACEOUS CYST;  Surgeon: [name], MD;  Location: [facility];  Service: General;  Laterality: Left;   No Known Allergies    Objective:    Physical Exam Vitals and nursing note reviewed.  Constitutional:      General: He is not in acute distress.    Appearance: Normal appearance.  HENT:     Head: Normocephalic.     Jaw: Swelling (mild on right TMJ) and pain on movement present. No tenderness or malocclusion.  Cardiovascular:     Rate and Rhythm: Normal rate and regular rhythm.  Pulmonary:     Effort: Pulmonary effort is normal.     Breath sounds: Normal breath sounds.  Musculoskeletal:        General: Normal range of motion.     Cervical back: Normal range of motion.     Left knee:  Swelling and bony tenderness present. No erythema. Normal range of motion.  Skin:    General: Skin is warm and dry.  Neurological:     Mental Status: He is alert and oriented to person, place, and time.  Psychiatric:        Mood and Affect: Mood normal.    BP 110/76 (BP Location: Left Arm, Patient Position: Sitting, Cuff Size: Large)   Pulse 73   Temp (!) 97 F (36.1 C) (Temporal)   Ht  (1.88 m)   Wt 207 lb 12.8 oz (94.3 kg)   SpO2 99%   BMI 26.68 kg/m  Wt Readings from Last 3 Encounters:  02/01/23 207 lb 12.8 oz (94.3 kg)  10/19/22 205 lb (93 kg)  10/15/21 225 lb 6.4 oz (102.2 kg)       Dulce Sellar, NP

## 2023-02-08 ENCOUNTER — Other Ambulatory Visit: Payer: Self-pay

## 2023-02-08 ENCOUNTER — Ambulatory Visit (INDEPENDENT_AMBULATORY_CARE_PROVIDER_SITE_OTHER): Payer: BC Managed Care – PPO | Admitting: Sports Medicine

## 2023-02-08 VITALS — BP 120/80 | Ht 74.0 in | Wt 207.0 lb

## 2023-02-08 DIAGNOSIS — M25562 Pain in left knee: Secondary | ICD-10-CM

## 2023-02-08 NOTE — Patient Instructions (Addendum)
Voltaren gel (over-the-counter) four times a day    Follow up in 4-6 weeks

## 2023-02-08 NOTE — Progress Notes (Signed)
  William Cowan - 37 y.o. male MRN [id number]  Date of birth: Apr 15, 1986    CHIEF COMPLAINT:   Left knee pain    SUBJECTIVE:   HPI:  Pleasant 37 year old male comes to clinic to be evaluated for left knee pain.  He has had pain at the anterior aspect of the left knee off and on for 3 years now.  Describes it as dull and achy.  He works a job where he is on his feet for 12 to 13 hours per shift which exacerbates his pain.  Denies any inciting injury or trauma.  Denies any catching or locking in the knee.  He saw his primary doctor who started him on naproxen which has helped a little bit in the last several days.  Of note, he reports bump on the anterior tibial aspect that has been there since his days of playing football as a high school player.  ROS:     See HPI  PERTINENT  PMH / PSH FH / / SH:  Past Medical, Surgical, Social, and Family History Reviewed & Updated in the EMR.  Pertinent findings include:  none  OBJECTIVE: BP 120/80   Ht 6\' 2"  (1.88 m)   Wt 207 lb (93.9 kg)   BMI 26.58 kg/m   Physical Exam:  Vital signs are reviewed.  GEN: Alert and oriented, NAD Pulm: Breathing unlabored PSY: normal mood, congruent affect  MSK: Left knee -there is an obvious bump at the tibial tubercle consistent with old Osgood slaughters disease.  He is mildly tender to palpation at the patellar tendon.  Nontender palpation at the medial or lateral joint lines.  Full range of motion flexion extension.  5/5 strength throughout.  No ligamentous instability valgus varus stress.  Negative Lachman.  Negative McMurray.  ULTRASOUND: MSK ultrasound left knee: Images were obtained both in the transverse and longitudinal plane. Patellar and quadriceps tendons were well visualized with no abnormalities in the quad.  He does have thickening of the left patellar tendon compared to the right.  Left patellar tendon thickness at its distal insertion is 0.96 cm, unaffected right side is 0.6 cm.  No  neovascularization. No effusion. Medial and lateral menisci were well visualized with no abnormalities.   Impression: Chronic patellar tendinitis   ASSESSMENT & PLAN:  1.  Left patellar tendinitis  -Tried conservative treatment with quad strengthening and multimodal pain control with continue naproxen and adding Voltaren gel.  Check on his progress in 4 to 6 weeks.  If no improvement may try nitroglycerin patch.  All question answered and agrees to plan.  Arvella Nigh, MD PGY-4, Sports Medicine Fellow [facility]  Addendum:  I was the preceptor for this visit and available for immediate consultation.  Norton Blizzard MD Marrianne Mood

## 2023-03-08 ENCOUNTER — Ambulatory Visit (INDEPENDENT_AMBULATORY_CARE_PROVIDER_SITE_OTHER): Payer: BC Managed Care – PPO | Admitting: Sports Medicine

## 2023-03-08 VITALS — BP 120/82 | Ht 74.0 in | Wt 207.0 lb

## 2023-03-08 DIAGNOSIS — M25562 Pain in left knee: Secondary | ICD-10-CM | POA: Diagnosis not present

## 2023-03-08 NOTE — Progress Notes (Unsigned)
  William Cowan - 37 y.o. male MRN [id number]  Date of birth: [birth year]    CHIEF COMPLAINT:   Left patellar tendinopathy    SUBJECTIVE:   HPI:  Pleasant 37 year old male here to follow-up left patellar tendinopathy.  He says he feels about the same.  The pain is not much better.  Still is intermittently painful near the tibial tubercle.  Made worse with walking.  Is open to other treatment options.  ROS:     See HPI  PERTINENT  PMH / PSH FH / / SH:  Past Medical, Surgical, Social, and Family History Reviewed & Updated in the EMR.  Pertinent findings include:  none  OBJECTIVE: BP 120/82   Ht 6\' 2"  (1.88 m)   Wt 207 lb (93.9 kg)   BMI 26.58 kg/m   Physical Exam:  Vital signs are reviewed.  GEN: Alert and oriented, NAD Pulm: Breathing unlabored PSY: normal mood, congruent affect  MSK: Left knee - small bony prominence over the tibial tubercle.  Mildly tender to palpation along the entire patellar tendon.  Nontender at the medial or lateral joint lines.  Full range of motion of the knee in flexion extension.  5/5 strength throughout.  ASSESSMENT & PLAN:  1.  Left patellar tendinopathy  -Discussed treatment options moving forward.  I offered the patient a trial of shockwave therapy or nitroglycerin patches.  He elects to try a first shockwave treatment today.  He will call us next week to see if he notices any improvement.  If he notices improvement, we will proceed with a few more shockwave treatments.  If not, then he will call to let us know and at that time I will send him in a prescription for nitroglycerin patches to follow the nitroglycerin protocol.  Either way I would like to follow-up with him in about a month irregardless of which pathway he chooses.  Procedure: ECSWT Indications: Left patellar tendinopathy   Procedure Details Consent: Risks of procedure as well as the alternatives and risks of each were explained to the patient.  Written consent for procedure  obtained. Time Out: Verified patient identification, verified procedure, site was marked, verified correct patient position, medications/allergies/relevent history reviewed.  The area was cleaned with alcohol swab.     The left patellar tendon was targeted for Extracorporeal shockwave therapy.    Preset: Patellar tendinopathy Power Level: 120 Frequency: 12 Impulse/cycles: 2000 Head size: large   Patient tolerated procedure well without immediate complications    Arvella Nigh, MD PGY-4, Sports Medicine Fellow [facility]  Addendum:  I was the preceptor for this visit and available for immediate consultation.  Norton Blizzard MD Marrianne Mood

## 2023-03-28 ENCOUNTER — Other Ambulatory Visit: Payer: Self-pay | Admitting: *Deleted

## 2023-03-28 MED ORDER — NITROGLYCERIN 0.2 MG/HR TD PT24: MEDICATED_PATCH | TRANSDERMAL | 1 refills | Status: DC

## 2023-08-07 DIAGNOSIS — H40013 Open angle with borderline findings, low risk, bilateral: Secondary | ICD-10-CM | POA: Diagnosis not present

## 2023-10-23 ENCOUNTER — Encounter: Payer: BC Managed Care – PPO | Admitting: Physician Assistant

## 2023-11-15 ENCOUNTER — Ambulatory Visit (INDEPENDENT_AMBULATORY_CARE_PROVIDER_SITE_OTHER): Payer: BC Managed Care – PPO | Admitting: Physician Assistant

## 2023-11-15 ENCOUNTER — Encounter: Payer: Self-pay | Admitting: Physician Assistant

## 2023-11-15 ENCOUNTER — Other Ambulatory Visit (HOSPITAL_COMMUNITY)
Admission: RE | Admit: 2023-11-15 | Discharge: 2023-11-15 | Disposition: A | Discharge status: Home / Self Care | Payer: BC Managed Care – PPO | Source: Ambulatory Visit | Attending: Physician Assistant | Admitting: Physician Assistant

## 2023-11-15 VITALS — BP 110/70 | HR 86 | Temp 98.4°F | Ht 74.0 in | Wt 212.5 lb

## 2023-11-15 DIAGNOSIS — Z113 Encounter for screening for infections with a predominantly sexual mode of transmission: Secondary | ICD-10-CM | POA: Insufficient documentation

## 2023-11-15 DIAGNOSIS — E663 Overweight: Secondary | ICD-10-CM | POA: Diagnosis not present

## 2023-11-15 DIAGNOSIS — E559 Vitamin D deficiency, unspecified: Secondary | ICD-10-CM

## 2023-11-15 DIAGNOSIS — Z0000 Encounter for general adult medical examination without abnormal findings: Secondary | ICD-10-CM

## 2023-11-15 NOTE — Patient Instructions (Signed)
It was great to see you!  Please go to the lab for blood work.   Our office will call you with your results unless you have chosen to receive results via MyChart.  If your blood work is normal we will follow-up each year for physicals and as scheduled for chronic medical problems.  If anything is abnormal we will treat accordingly and get you in for a follow-up.  Take care,  Lelon Mast

## 2023-11-15 NOTE — Progress Notes (Signed)
Subjective:    William Cowan is a 38 y.o. male and is here for a comprehensive physical exam.  HPI  There are no preventive care reminders to display for this patient.  Acute Concerns: None  Chronic Issues: none  Health Maintenance: Immunizations -- N/A Colonoscopy -- N/A PSA -- No results found for: "PSA1", "PSA" Diet -- somewhat healthy, balanced diet. Avoids fast food. Sleep habits -- no complaints Exercise -- minimal exercise due to school and schedule changing.   Weight --  Recent weight history Wt Readings from Last 10 Encounters:  11/15/23 212 lb 8 oz (96.4 kg)  03/08/23 207 lb (93.9 kg)  02/08/23 207 lb (93.9 kg)  02/01/23 207 lb 12.8 oz (94.3 kg)  10/19/22 205 lb (93 kg)  10/15/21 225 lb 6.4 oz (102.2 kg)  10/14/20 207 lb 3.2 oz (94 kg)  08/20/20 214 lb 6.1 oz (97.2 kg)  12/30/19 186 lb 1.1 oz (84.4 kg)  10/14/19 183 lb 3.2 oz (83.1 kg)   Body mass index is 27.28 kg/m.  Mood -- stable Alcohol use --  reports current alcohol use of about 10.0 standard drinks of alcohol per week.  Tobacco use --  Tobacco Use: Medium Risk (11/15/2023)   Patient History    Smoking Tobacco Use: Former    Smokeless Tobacco Use: Never    Passive Exposure: Not on file    Eligible for Low Dose CT?  UTD with eye doctor? yes UTD with dentist? yes     11/15/2023    3:11 PM  Depression screen PHQ 2/9  Decreased Interest 0  Down, Depressed, Hopeless 0  PHQ - 2 Score 0    Other providers/specialists: Patient Care Team: Jarold Motto, [place] as PCP - General (Physician Assistant)    PMHx, SurgHx, SocialHx, Medications, and Allergies were reviewed in the Visit Navigator and updated as appropriate.   Past Medical History:  Diagnosis Date   Chronic bronchitis (HCC)    GERD (gastroesophageal reflux disease)    History of chicken pox      Past Surgical History:  Procedure Laterality Date   CYST EXCISION Left 12/30/2019   Procedure: EXCISION OF LEFT GROIN CHRONIC  SEBACEOUS CYST;  Surgeon: [name], MD;  Location: [facility];  Service: General;  Laterality: Left;     Family History  Problem Relation Age of Onset   Multiple myeloma Mother    COPD Mother    Heart failure Father    Hypertension Father    Heart attack Father    Hyperlipidemia Father    Bone cancer Father    Lung cancer Maternal Grandfather    Colon cancer Neg Hx     Social History   Tobacco Use   Smoking status: Former    Types: Cigars, E-cigarettes   Smokeless tobacco: Never  Vaping Use   Vaping status: Former  Substance Use Topics   Alcohol use: Yes    Alcohol/week: 10.0 standard drinks of alcohol    Types: 10 Shots of liquor per week    Comment: occas   Drug use: Not Currently    Types: Marijuana    Review of Systems:   Review of Systems  Constitutional:  Negative for chills, fever, malaise/fatigue and weight loss.  HENT:  Negative for hearing loss, sinus pain and sore throat.   Respiratory:  Negative for cough and hemoptysis.   Cardiovascular:  Negative for chest pain, palpitations, leg swelling and PND.  Gastrointestinal:  Negative for abdominal pain, constipation, diarrhea,  heartburn, nausea and vomiting.  Genitourinary:  Negative for dysuria, frequency and urgency.  Musculoskeletal:  Negative for back pain, myalgias and neck pain.  Skin:  Negative for itching and rash.  Neurological:  Negative for dizziness, tingling, seizures and headaches.  Endo/Heme/Allergies:  Negative for polydipsia.  Psychiatric/Behavioral:  Negative for depression. The patient is not nervous/anxious.     Objective:    Vitals:   11/15/23 1512  BP: 110/70  Pulse: 86  Temp: 98.4 F (36.9 C)  SpO2: 98%    Body mass index is 27.28 kg/m.  General  Alert, cooperative, no distress, appears stated age  Head:  Normocephalic, without obvious abnormality, atraumatic  Eyes:  PERRL, conjunctiva/corneas clear, EOM's intact, fundi benign, both eyes        Ears:  Normal TM's and external ear canals, both ears  Nose: Nares normal, septum midline, mucosa normal, no drainage or sinus tenderness  Throat: Lips, mucosa, and tongue normal; teeth and gums normal  Neck: Supple, symmetrical, trachea midline, no adenopathy;     thyroid:  No enlargement/tenderness/nodules; no carotid bruit or JVD  Back:   Symmetric, no curvature, ROM normal, no CVA tenderness  Lungs:   Clear to auscultation bilaterally, respirations unlabored  Chest wall:  No tenderness or deformity  Heart:  Regular rate and rhythm, S1 and S2 normal, no murmur, rub or gallop  Abdomen:   Soft, non-tender, bowel sounds active all four quadrants, no masses, no organomegaly  Extremities: Extremities normal, atraumatic, no cyanosis or edema  Prostate : Deferred   Skin: Skin color, texture, turgor normal, no rashes or lesions  Lymph nodes: Cervical, supraclavicular, and axillary nodes normal  Neurologic: CNII-XII grossly intact. Normal strength, sensation and reflexes throughout   AssessmentPlan:   Routine physical examination Today patient counseled on age appropriate routine health concerns for screening and prevention, each reviewed and up to date or declined. Immunizations reviewed and up to date or declined. Labs ordered and reviewed. Risk factors for depression reviewed and negative. Hearing function and visual acuity are intact. ADLs screened and addressed as needed. Functional ability and level of safety reviewed and appropriate. Education, counseling and referrals performed based on assessed risks today. Patient provided with a copy of personalized plan for preventive services.  Vitamin D deficiency Update vitamin D and provide recommendations   Overweight Continue efforts at exercise  Screening examination for STD (sexually transmitted disease) Update blood and urine tests Recommend safe sex practices Treat based on results    Jarold Motto, PA-C [facility]  [facility],acting as a Neurosurgeon for [employer], PA.,have documented all relevant documentation on the behalf of Jarold Motto, PA,as directed by  Jarold Motto, PA while in the presence of Jarold Motto, [place].   I, Jarold Motto, [place], have reviewed all documentation for this visit. The documentation on 11/15/23 for the exam, diagnosis, procedures, and orders are all accurate and complete.

## 2023-11-16 ENCOUNTER — Encounter: Payer: Self-pay | Admitting: Physician Assistant

## 2023-11-16 ENCOUNTER — Other Ambulatory Visit: Payer: Self-pay | Admitting: Physician Assistant

## 2023-11-16 DIAGNOSIS — R7989 Other specified abnormal findings of blood chemistry: Secondary | ICD-10-CM

## 2023-11-16 LAB — COMPREHENSIVE METABOLIC PANEL
ALT: 58 U/L — ABNORMAL HIGH (ref 0–53)
AST: 43 U/L — ABNORMAL HIGH (ref 0–37)
Albumin: 4.9 g/dL (ref 3.5–5.2)
Alkaline Phosphatase: 50 U/L (ref 39–117)
BUN: 15 mg/dL (ref 6–23)
CO2: 26 meq/L (ref 19–32)
Calcium: 10.1 mg/dL (ref 8.4–10.5)
Chloride: 103 meq/L (ref 96–112)
Creatinine, Ser: 1.13 mg/dL (ref 0.40–1.50)
GFR: 84.34 mL/min (ref >60.00)
Glucose, Bld: 83 mg/dL (ref 70–99)
Potassium: 4 meq/L (ref 3.5–5.1)
Sodium: 139 meq/L (ref 135–145)
Total Bilirubin: 0.6 mg/dL (ref 0.2–1.2)
Total Protein: 8.2 g/dL (ref 6.0–8.3)

## 2023-11-16 LAB — CBC WITH DIFFERENTIAL/PLATELET
Basophils Absolute: 0 10*3/uL (ref 0.0–0.1)
Basophils Relative: 0.4 % (ref 0.0–3.0)
Eosinophils Absolute: 0.1 10*3/uL (ref 0.0–0.7)
Eosinophils Relative: 1.3 % (ref 0.0–5.0)
HCT: 49 % (ref 39.0–52.0)
Hemoglobin: 16.3 g/dL (ref 13.0–17.0)
Lymphocytes Relative: 30.1 % (ref 12.0–46.0)
Lymphs Abs: 1.5 10*3/uL (ref 0.7–4.0)
MCHC: 33.2 g/dL (ref 30.0–36.0)
MCV: 91.5 fL (ref 78.0–100.0)
Monocytes Absolute: 0.8 10*3/uL (ref 0.1–1.0)
Monocytes Relative: 15.2 % — ABNORMAL HIGH (ref 3.0–12.0)
Neutro Abs: 2.6 10*3/uL (ref 1.4–7.7)
Neutrophils Relative %: 53 % (ref 43.0–77.0)
Platelets: 319 10*3/uL (ref 150.0–400.0)
RBC: 5.35 Mil/uL (ref 4.22–5.81)
RDW: 13.8 % (ref 11.5–15.5)
WBC: 4.9 10*3/uL (ref 4.0–10.5)

## 2023-11-16 LAB — HIV ANTIBODY (ROUTINE TESTING W REFLEX): HIV 1&2 Ab, 4th Generation: NONREACTIVE

## 2023-11-16 LAB — LIPID PANEL
Cholesterol: 173 mg/dL (ref 0–200)
HDL: 53 mg/dL (ref >39.00)
LDL Cholesterol: 102 mg/dL — ABNORMAL HIGH (ref 0–99)
NonHDL: 120.44
Total CHOL/HDL Ratio: 3
Triglycerides: 94 mg/dL (ref 0.0–149.0)
VLDL: 18.8 mg/dL (ref 0.0–40.0)

## 2023-11-16 LAB — VITAMIN D 25 HYDROXY (VIT D DEFICIENCY, FRACTURES): VITD: 21.84 ng/mL — ABNORMAL LOW (ref 30.00–100.00)

## 2023-11-16 LAB — RPR: RPR Ser Ql: NONREACTIVE

## 2023-11-16 MED ORDER — VITAMIN D (ERGOCALCIFEROL) 1.25 MG (50000 UNIT) PO CAPS: 50000.0000 [IU] | ORAL_CAPSULE | ORAL | 0 refills | Status: AC

## 2023-11-17 LAB — URINE CYTOLOGY ANCILLARY ONLY
Chlamydia: NEGATIVE
Comment: NEGATIVE
Comment: NEGATIVE
Comment: NORMAL
Neisseria Gonorrhea: NEGATIVE
Trichomonas: NEGATIVE
